# Patient Record
Sex: Male | Born: 1950 | Race: White | Hispanic: No | Marital: Married | Smoking: Never smoker
Health system: Southern US, Community
[De-identification: ages and names within clinical notes are randomized; demographics above are authoritative.]

## PROBLEM LIST (undated history)

## (undated) DIAGNOSIS — M1990 Unspecified osteoarthritis, unspecified site: Secondary | ICD-10-CM

## (undated) DIAGNOSIS — F32A Depression, unspecified: Secondary | ICD-10-CM

## (undated) DIAGNOSIS — F319 Bipolar disorder, unspecified: Secondary | ICD-10-CM

## (undated) DIAGNOSIS — G8929 Other chronic pain: Secondary | ICD-10-CM

## (undated) DIAGNOSIS — I10 Essential (primary) hypertension: Secondary | ICD-10-CM

## (undated) DIAGNOSIS — F329 Major depressive disorder, single episode, unspecified: Secondary | ICD-10-CM

## (undated) DIAGNOSIS — K219 Gastro-esophageal reflux disease without esophagitis: Secondary | ICD-10-CM

## (undated) HISTORY — DX: Major depressive disorder, single episode, unspecified: F32.9

## (undated) HISTORY — PX: KENALOG INJECTION: SHX5298

## (undated) HISTORY — PX: TONSILLECTOMY: SUR1361

## (undated) HISTORY — DX: Bipolar disorder, unspecified: F31.9

## (undated) HISTORY — PX: COLONOSCOPY: SHX174

## (undated) HISTORY — DX: Depression, unspecified: F32.A

## (undated) HISTORY — DX: Unspecified osteoarthritis, unspecified site: M19.90

## (undated) HISTORY — DX: Essential (primary) hypertension: I10

## (undated) HISTORY — DX: Gastro-esophageal reflux disease without esophagitis: K21.9

## (undated) HISTORY — DX: Other chronic pain: G89.29

---

## 2005-01-28 ENCOUNTER — Encounter: Admission: RE | Admit: 2005-01-28 | Discharge: 2005-01-28 | Payer: Self-pay | Admitting: Internal Medicine

## 2007-08-14 ENCOUNTER — Encounter: Admission: RE | Admit: 2007-08-14 | Discharge: 2007-08-14 | Payer: Self-pay | Admitting: Gastroenterology

## 2009-08-09 ENCOUNTER — Emergency Department (HOSPITAL_COMMUNITY): Admission: EM | Admit: 2009-08-09 | Discharge: 2009-08-09 | Payer: Self-pay | Admitting: Emergency Medicine

## 2009-10-23 ENCOUNTER — Ambulatory Visit (HOSPITAL_COMMUNITY): Admission: RE | Admit: 2009-10-23 | Discharge: 2009-10-23 | Payer: Self-pay | Admitting: Family Medicine

## 2009-12-20 ENCOUNTER — Inpatient Hospital Stay (HOSPITAL_COMMUNITY): Admission: EM | Admit: 2009-12-20 | Discharge: 2009-12-21 | Payer: Self-pay | Admitting: Emergency Medicine

## 2010-01-14 ENCOUNTER — Ambulatory Visit (HOSPITAL_COMMUNITY): Admission: RE | Admit: 2010-01-14 | Discharge: 2010-01-14 | Payer: Self-pay | Admitting: Family Medicine

## 2010-02-25 ENCOUNTER — Emergency Department (HOSPITAL_COMMUNITY): Admission: EM | Admit: 2010-02-25 | Discharge: 2009-04-28 | Payer: Self-pay | Admitting: Emergency Medicine

## 2010-06-03 LAB — COMPREHENSIVE METABOLIC PANEL
ALT: 8 U/L (ref 0–53)
AST: 12 U/L (ref 0–37)
Albumin: 4.1 g/dL (ref 3.5–5.2)
Alkaline Phosphatase: 43 U/L (ref 39–117)
BUN: 7 mg/dL (ref 6–23)
CO2: 29 mEq/L (ref 19–32)
Calcium: 8.9 mg/dL (ref 8.4–10.5)
Chloride: 98 mEq/L (ref 96–112)
Creatinine, Ser: 1.11 mg/dL (ref 0.4–1.5)
GFR calc Af Amer: >60 mL/min (ref >60)
GFR calc non Af Amer: >60 mL/min (ref >60)
Glucose, Bld: 91 mg/dL (ref 70–99)
Potassium: 3.5 mEq/L (ref 3.5–5.1)
Sodium: 138 mEq/L (ref 135–145)
Total Bilirubin: 0.4 mg/dL (ref 0.3–1.2)
Total Protein: 7.1 g/dL (ref 6.0–8.3)

## 2010-06-03 LAB — CBC
HCT: 31.5 % — ABNORMAL LOW (ref 39.0–52.0)
HCT: 31.8 % — ABNORMAL LOW (ref 39.0–52.0)
Hemoglobin: 11 g/dL — ABNORMAL LOW (ref 13.0–17.0)
MCH: 31.5 pg (ref 26.0–34.0)
MCHC: 34.8 g/dL (ref 30.0–36.0)
MCV: 90.6 fL (ref 78.0–100.0)
MCV: 91.6 fL (ref 78.0–100.0)
Platelets: 226 10*3/uL (ref 150–400)
RBC: 3.47 MIL/uL — ABNORMAL LOW (ref 4.22–5.81)
RBC: 3.48 MIL/uL — ABNORMAL LOW (ref 4.22–5.81)
RDW: 13.1 % (ref 11.5–15.5)
WBC: 8.7 10*3/uL (ref 4.0–10.5)
WBC: 9.5 10*3/uL (ref 4.0–10.5)

## 2010-06-03 LAB — RAPID URINE DRUG SCREEN, HOSP PERFORMED
Amphetamines: POSITIVE — AB
Barbiturates: NOT DETECTED
Benzodiazepines: NOT DETECTED
Cocaine: NOT DETECTED
Opiates: POSITIVE — AB
Tetrahydrocannabinol: NOT DETECTED

## 2010-06-03 LAB — DIFFERENTIAL
Basophils Absolute: 0 10*3/uL (ref 0.0–0.1)
Basophils Relative: 0 % (ref 0–1)
Eosinophils Absolute: 0.5 10*3/uL (ref 0.0–0.7)
Eosinophils Relative: 2 % (ref 0–5)
Eosinophils Relative: 6 % — ABNORMAL HIGH (ref 0–5)
Lymphocytes Relative: 20 % (ref 12–46)
Lymphocytes Relative: 29 % (ref 12–46)
Lymphs Abs: 1.9 10*3/uL (ref 0.7–4.0)
Lymphs Abs: 2.5 10*3/uL (ref 0.7–4.0)
Monocytes Absolute: 0.9 10*3/uL (ref 0.1–1.0)
Monocytes Absolute: 1.2 10*3/uL — ABNORMAL HIGH (ref 0.1–1.0)
Monocytes Relative: 13 % — ABNORMAL HIGH (ref 3–12)
Neutro Abs: 5.8 10*3/uL (ref 1.7–7.7)
Neutrophils Relative %: 61 % (ref 43–77)

## 2010-06-03 LAB — BASIC METABOLIC PANEL
BUN: 5 mg/dL — ABNORMAL LOW (ref 6–23)
CO2: 29 mEq/L (ref 19–32)
Calcium: 8.9 mg/dL (ref 8.4–10.5)
Chloride: 105 mEq/L (ref 96–112)
Creatinine, Ser: 0.85 mg/dL (ref 0.4–1.5)
GFR calc Af Amer: >60 mL/min (ref >60)
GFR calc non Af Amer: >60 mL/min (ref >60)
Glucose, Bld: 96 mg/dL (ref 70–99)
Potassium: 3.4 mEq/L — ABNORMAL LOW (ref 3.5–5.1)
Sodium: 144 mEq/L (ref 135–145)

## 2010-06-03 LAB — TSH: TSH: 2.289 u[IU]/mL (ref 0.350–4.500)

## 2010-06-03 LAB — CARDIAC PANEL(CRET KIN+CKTOT+MB+TROPI)
CK, MB: 0.7 ng/mL (ref 0.3–4.0)
CK, MB: 0.7 ng/mL (ref 0.3–4.0)
Relative Index: INVALID (ref 0.0–2.5)
Relative Index: INVALID (ref 0.0–2.5)
Total CK: 41 U/L (ref 7–232)
Total CK: 42 U/L (ref 7–232)
Troponin I: 0.01 ng/mL (ref 0.00–0.06)
Troponin I: 0.01 ng/mL (ref 0.00–0.06)

## 2010-06-03 LAB — URINALYSIS, ROUTINE W REFLEX MICROSCOPIC
Bilirubin Urine: NEGATIVE
Glucose, UA: NEGATIVE mg/dL
Ketones, ur: NEGATIVE mg/dL
Leukocytes, UA: NEGATIVE
Nitrite: NEGATIVE
Protein, ur: NEGATIVE mg/dL
Specific Gravity, Urine: 1.01 (ref 1.005–1.030)
Urobilinogen, UA: 0.2 mg/dL (ref 0.0–1.0)
pH: 6 (ref 5.0–8.0)

## 2010-06-03 LAB — POCT CARDIAC MARKERS
CKMB, poc: <1 ng/mL — ABNORMAL LOW (ref 1.0–8.0)
Myoglobin, poc: 72.5 ng/mL (ref 12–200)
Troponin i, poc: <0.05 ng/mL (ref 0.00–0.09)

## 2010-06-03 LAB — IRON AND TIBC: UIBC: 248 ug/dL

## 2010-06-03 LAB — URINE MICROSCOPIC-ADD ON

## 2010-06-03 LAB — CK TOTAL AND CKMB (NOT AT ARMC)
CK, MB: 0.8 ng/mL (ref 0.3–4.0)
Relative Index: INVALID (ref 0.0–2.5)
Total CK: 51 U/L (ref 7–232)

## 2010-06-03 LAB — CULTURE, BLOOD (ROUTINE X 2)
Culture: NO GROWTH
Culture: NO GROWTH
Report Status: 10072011
Report Status: 10072011

## 2010-06-03 LAB — MRSA PCR SCREENING: MRSA by PCR: NEGATIVE

## 2010-06-03 LAB — TROPONIN I: Troponin I: 0.02 ng/mL (ref 0.00–0.06)

## 2010-06-03 LAB — RETICULOCYTES
RBC.: 3.47 MIL/uL — ABNORMAL LOW (ref 4.22–5.81)
Retic Ct Pct: 1.1 % (ref 0.4–3.1)

## 2010-06-03 LAB — VALPROIC ACID LEVEL: Valproic Acid Lvl: 67 ug/mL (ref 50.0–100.0)

## 2010-06-03 LAB — LACTIC ACID, PLASMA: Lactic Acid, Venous: 1 mmol/L (ref 0.5–2.2)

## 2010-06-03 LAB — FERRITIN: Ferritin: 90 ng/mL (ref 22–322)

## 2010-06-03 LAB — VITAMIN B12: Vitamin B-12: 577 pg/mL (ref 211–911)

## 2010-10-01 DIAGNOSIS — M545 Low back pain, unspecified: Secondary | ICD-10-CM | POA: Insufficient documentation

## 2010-10-01 DIAGNOSIS — Z79899 Other long term (current) drug therapy: Secondary | ICD-10-CM | POA: Insufficient documentation

## 2010-10-01 DIAGNOSIS — M542 Cervicalgia: Secondary | ICD-10-CM | POA: Insufficient documentation

## 2010-10-01 DIAGNOSIS — M48061 Spinal stenosis, lumbar region without neurogenic claudication: Secondary | ICD-10-CM | POA: Insufficient documentation

## 2011-06-30 IMAGING — CT CT HEAD W/O CM
1 series · 16 of 30 positions shown, 20 images · non-contrast
Comparison: None

CLINICAL DATA: Altered level of consciousness, confusion,
hypertension

CT HEAD WITHOUT CONTRAST
TECHNIQUE: Contiguous axial images were obtained from the base of
the skull through the vertex without contrast.

[Series 2: headseq 4.8 h37s · axial · 0.44mm/px · z∈[+941,+1101]mm · 16 of 36 slices shown, 20 images]
[im 2/36  brain]
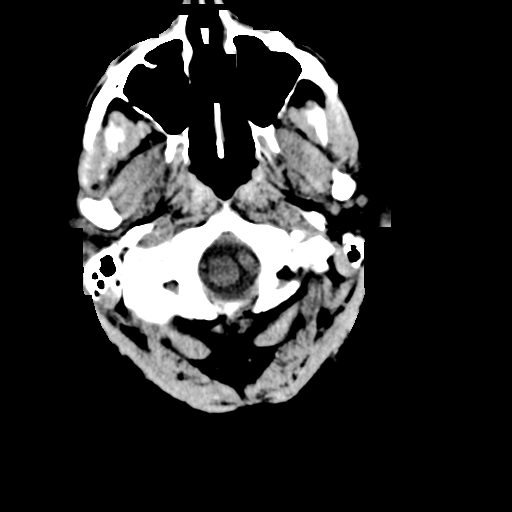
[im 2/36  bone]
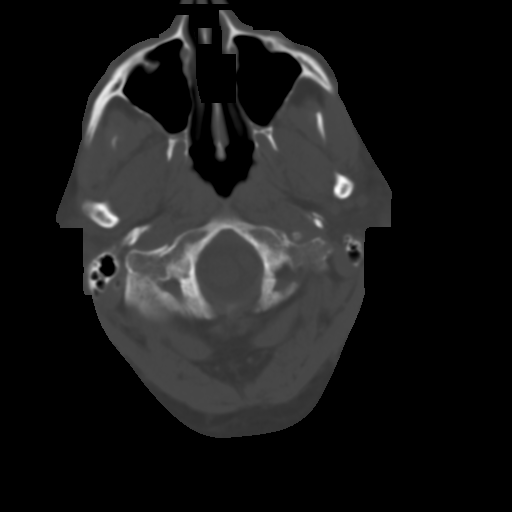
[im 4/36  brain]
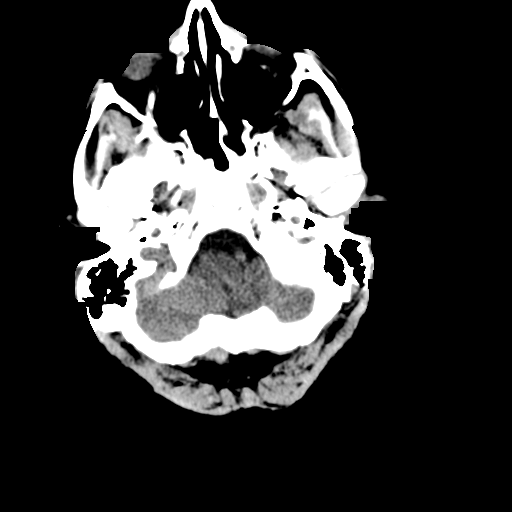
[im 7/36  brain]
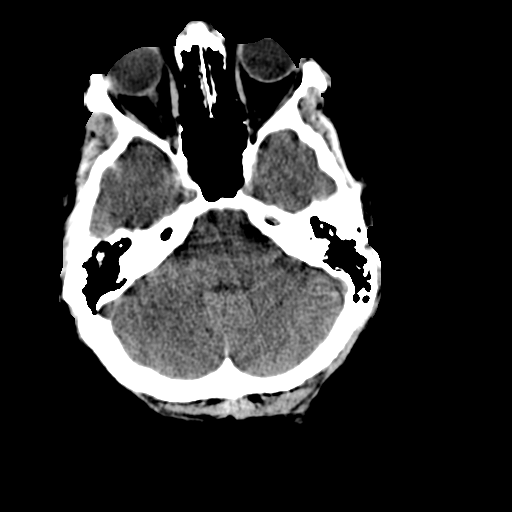
[im 9/36  brain]
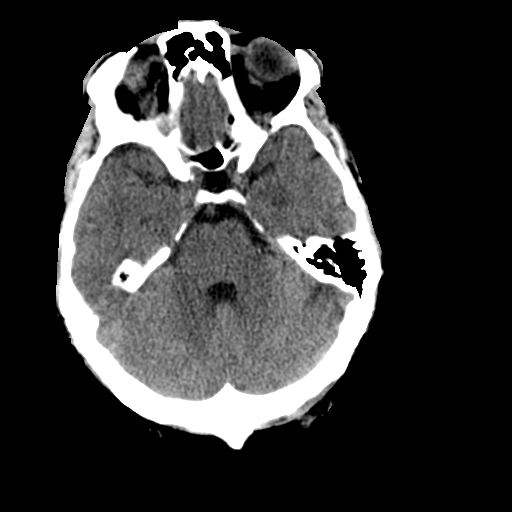
[im 10/36  brain]
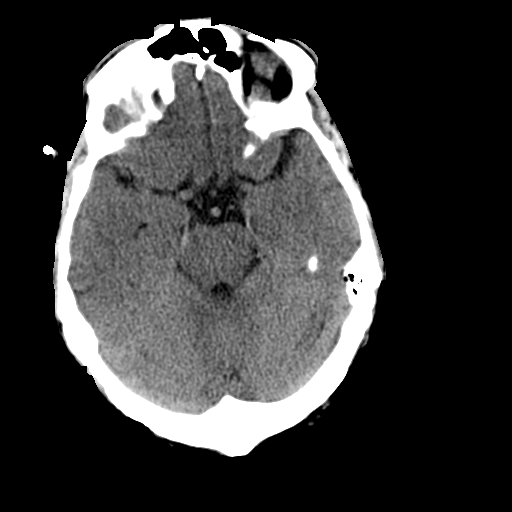
[im 10/36  bone]
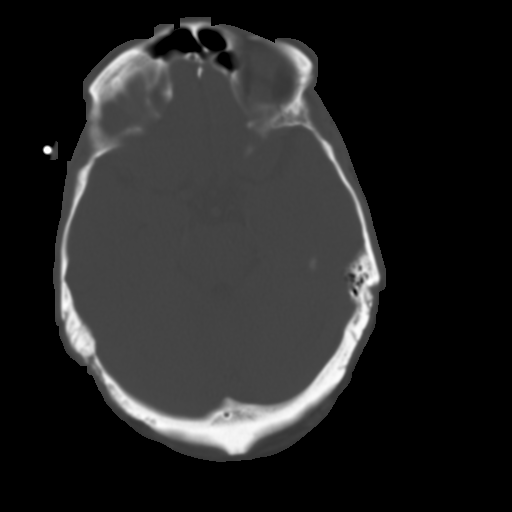
[im 13/36  brain]
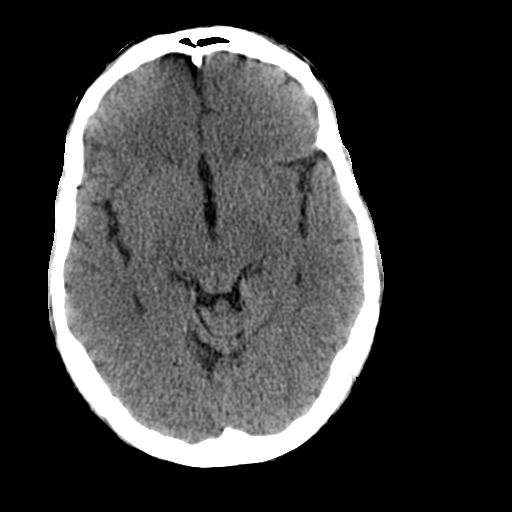
[im 15/36  brain]
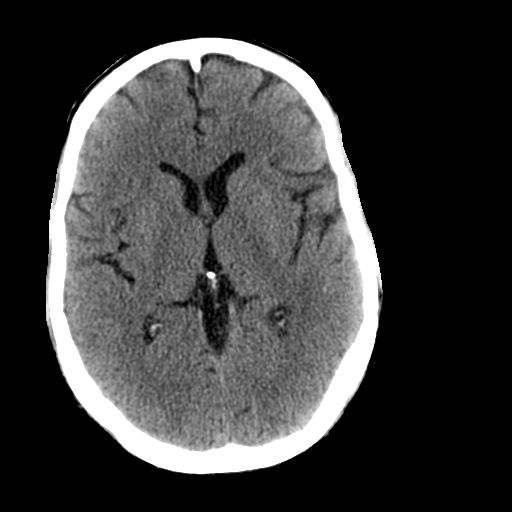
[im 17/36  brain]
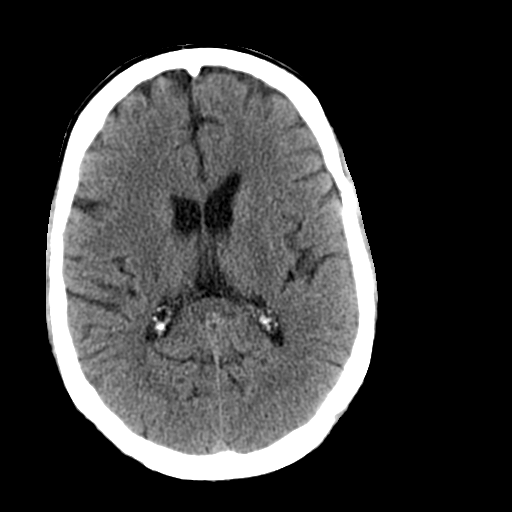
[im 19/36  brain]
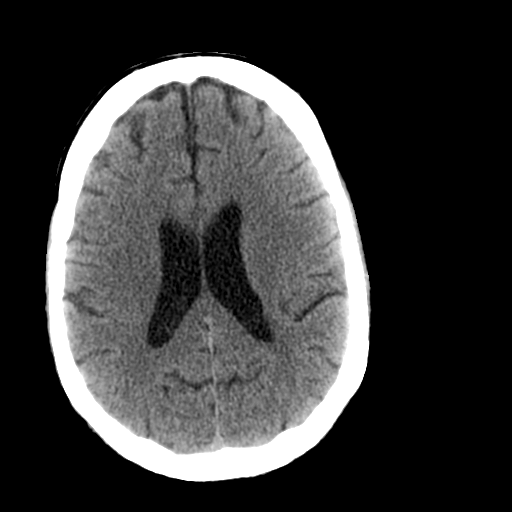
[im 19/36  bone]
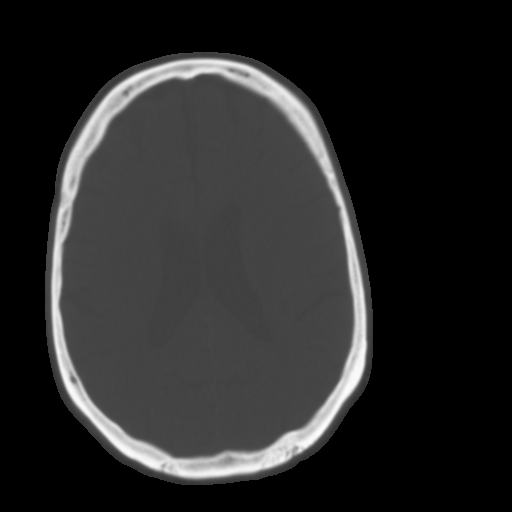
[im 21/36  brain]
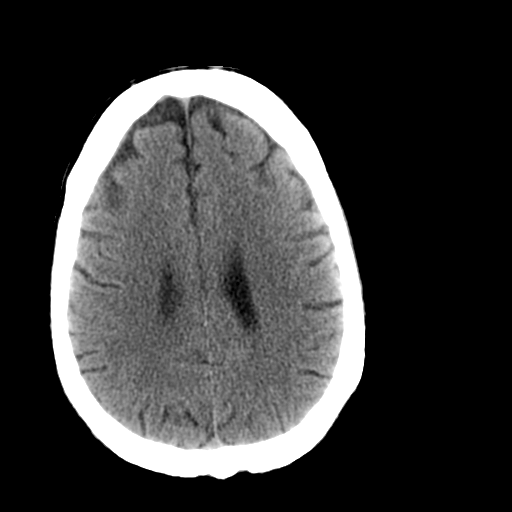
[im 23/36  brain]
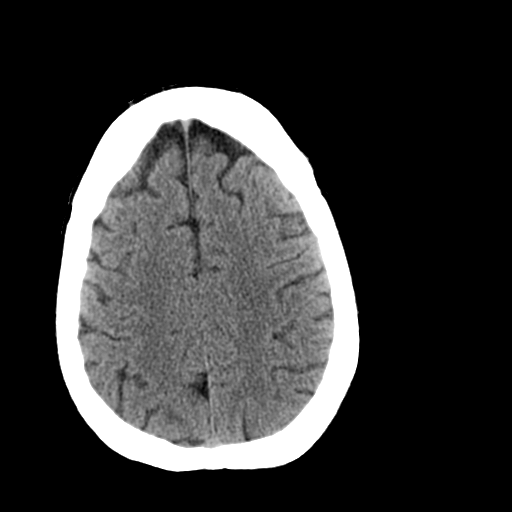
[im 26/36  brain]
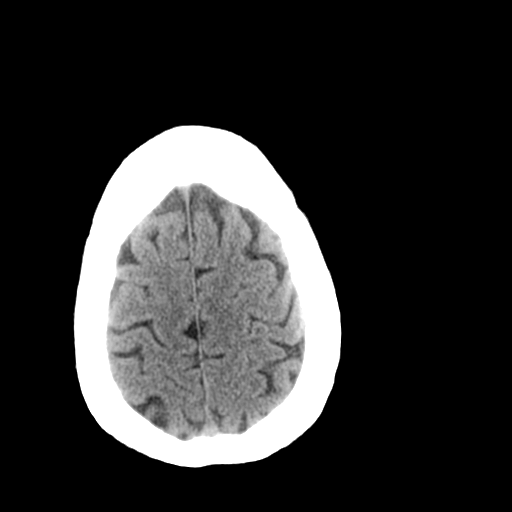
[im 27/36  brain]
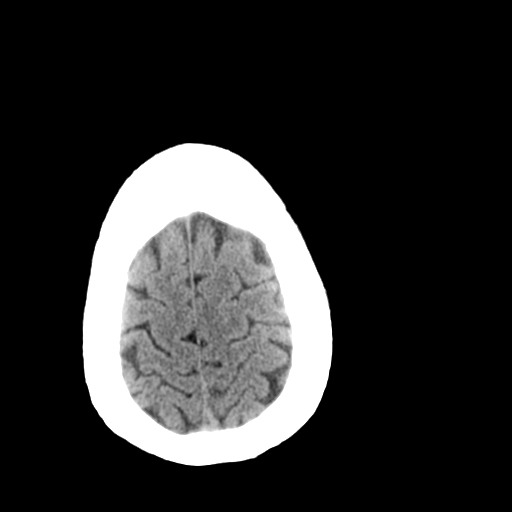
[im 27/36  bone]
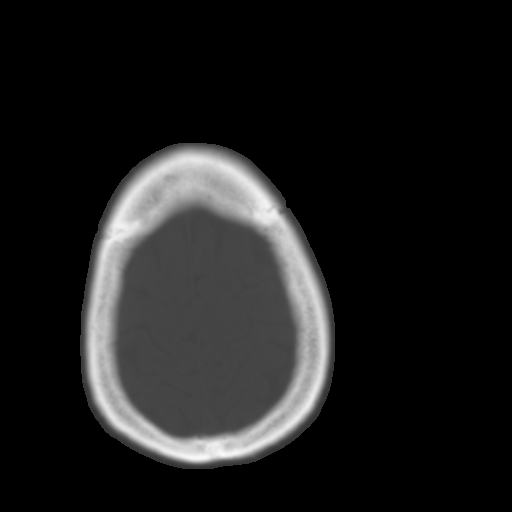
[im 29/36  brain]
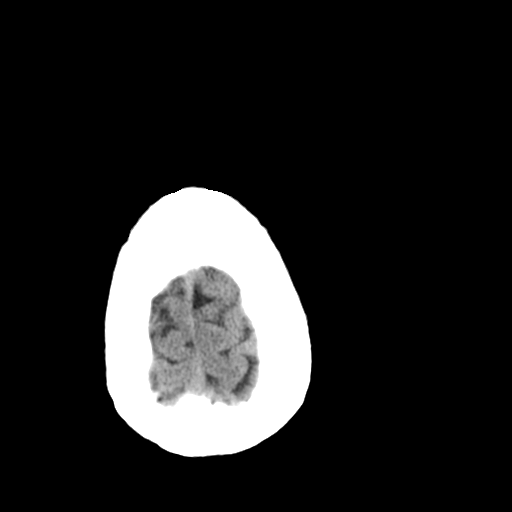
[im 32/36  brain]
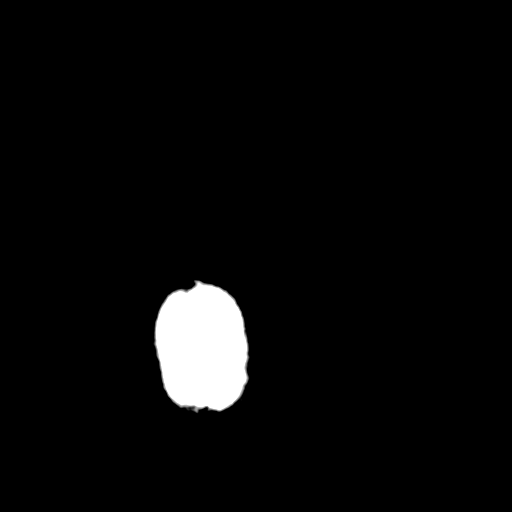
[im 34/36  brain]
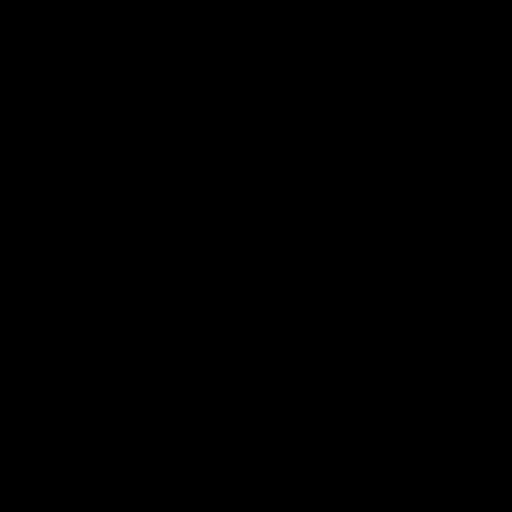

[16 of 30 positions shown; findings below may reference images not displayed]

FINDINGS: Normal ventricular morphology.
No midline shift or mass effect.
Normal appearance of brain parenchyma.
No intracranial hemorrhage, mass lesion, or acute infarction.
Visualized paranasal sinuses and mastoid air cells clear.
Bones unremarkable.
IMPRESSION: No acute intracranial abnormalities.

## 2011-07-25 IMAGING — CR DG CHEST 2V
2 series · 2 of 2 positions shown · non-contrast
Comparison: 12/20/2009

CLINICAL DATA: Pneumonia

CHEST - 2 VIEW

[view not recorded (1 of 2)]
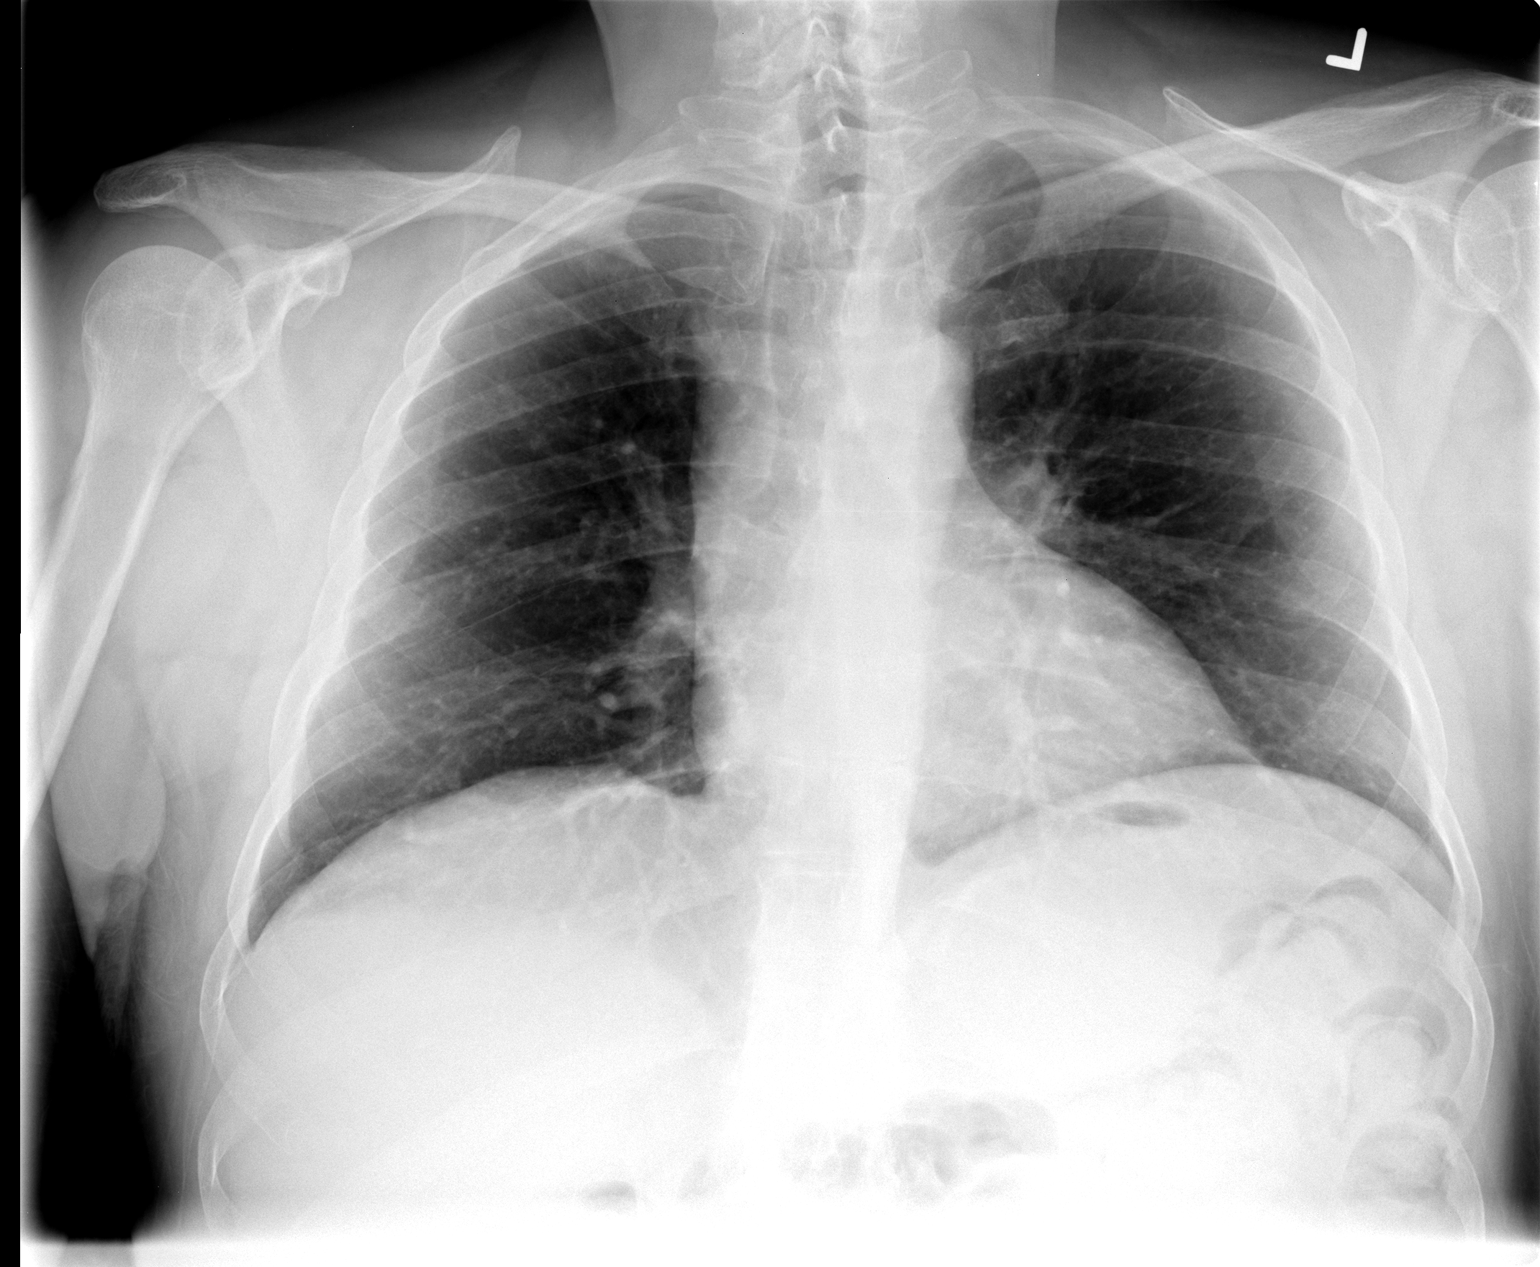

[view not recorded (2 of 2)]
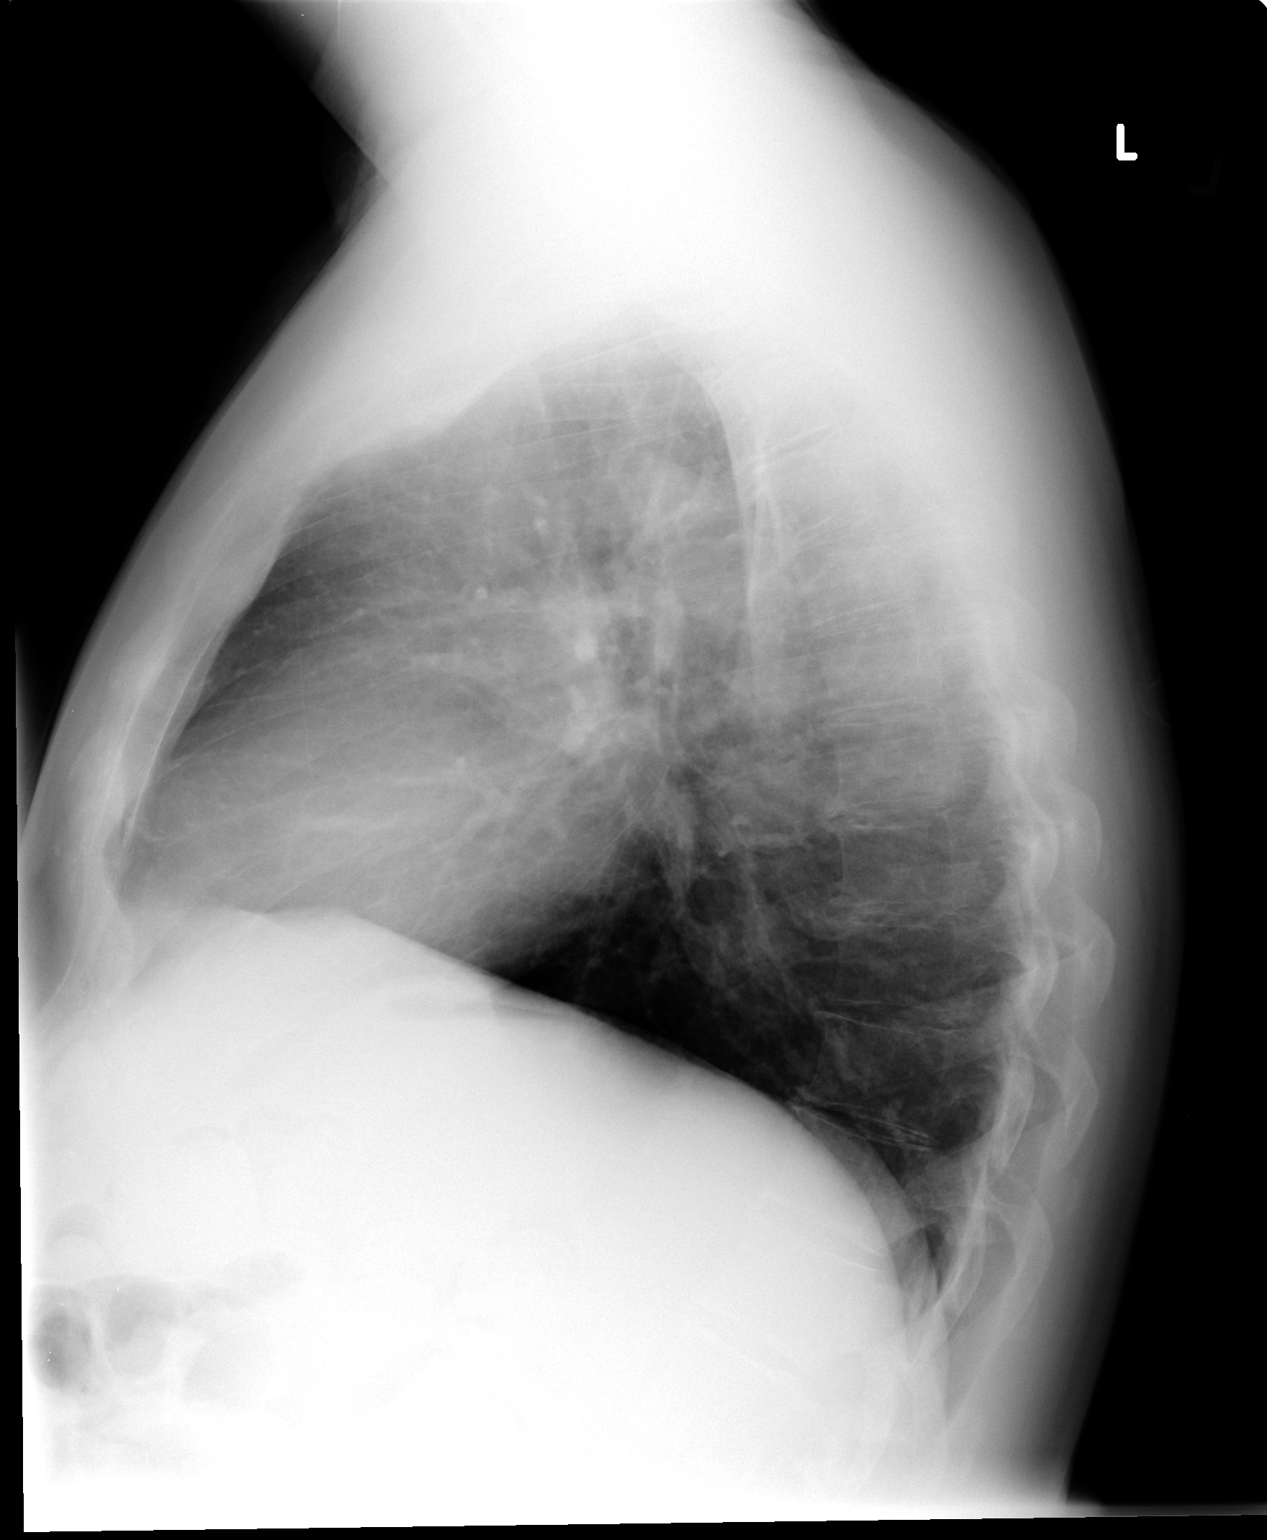

[2 of 2 positions shown; findings below may reference images not displayed]

FINDINGS: There has been interval resolution of the right lung
airspace disease.  Lungs are clear.  Heart size normal.  No
effusion.  Regional bones unremarkable.
IMPRESSION: 1.  Resolution of the right   lung pneumonia.

## 2012-07-13 ENCOUNTER — Other Ambulatory Visit (HOSPITAL_COMMUNITY): Payer: Self-pay | Admitting: Family Medicine

## 2012-07-13 ENCOUNTER — Telehealth: Payer: Self-pay | Admitting: Family Medicine

## 2012-07-13 NOTE — Telephone Encounter (Signed)
Patient needs a refill of his generic lotrel to [facility]. He has an appointment for a PE in the next week.

## 2012-07-13 NOTE — Telephone Encounter (Signed)
Med sent electronically to [facility].

## 2012-07-23 ENCOUNTER — Encounter: Payer: Self-pay | Admitting: Family Medicine

## 2012-07-23 ENCOUNTER — Ambulatory Visit (INDEPENDENT_AMBULATORY_CARE_PROVIDER_SITE_OTHER): Payer: Medicare Other | Admitting: Family Medicine

## 2012-07-23 VITALS — BP 150/78 | HR 80 | Ht 66.5 in | Wt 175.0 lb

## 2012-07-23 DIAGNOSIS — Z0000 Encounter for general adult medical examination without abnormal findings: Secondary | ICD-10-CM

## 2012-07-23 DIAGNOSIS — I10 Essential (primary) hypertension: Secondary | ICD-10-CM

## 2012-07-23 DIAGNOSIS — Z125 Encounter for screening for malignant neoplasm of prostate: Secondary | ICD-10-CM

## 2012-07-23 DIAGNOSIS — Z79899 Other long term (current) drug therapy: Secondary | ICD-10-CM

## 2012-07-23 MED ORDER — BENAZEPRIL HCL 20 MG PO TABS: 20.0000 mg | ORAL_TABLET | Freq: Every day | ORAL | Status: DC

## 2012-07-23 MED ORDER — RANITIDINE HCL 150 MG PO TABS: ORAL_TABLET | ORAL | Status: DC

## 2012-07-23 MED ORDER — ZOLPIDEM TARTRATE 10 MG PO TABS: 10.0000 mg | ORAL_TABLET | Freq: Every evening | ORAL | Status: DC | PRN

## 2012-07-23 MED ORDER — AMLODIPINE BESYLATE 10 MG PO TABS: 10.0000 mg | ORAL_TABLET | Freq: Every day | ORAL | Status: DC

## 2012-07-23 NOTE — Progress Notes (Signed)
  Subjective:    Patient ID: COBI DELPH, male    DOB: 04/23/1950, 62 y.o.   MRN: [id number]  HPI On chronic pain meds and q three monthly injections of steroids and xylocaine.   On bp meds. Very little exercise.  overall watching diet fairly closely.  Added simethicone qhs to help reflux Patient up-to-date on flu vaccine. Recently turned 62. Next colonoscopy due in 2019. Patient notes history of carpal tunnel syndrome. Worse somewhat recently. Wearing his brace during the day.  Patient claims compliance with his blood pressure medicine. Trying to watch his diet.  Patient states his depression is completely stable. He needs his Prozac refilled. No suicidal thoughts. Review of Systems  Constitutional: Negative for fever, activity change and appetite change.  HENT: Negative for congestion, rhinorrhea and neck pain.   Eyes: Negative for discharge.  Respiratory: Negative for cough and wheezing.   Cardiovascular: Negative for chest pain.  Gastrointestinal: Negative for vomiting, abdominal pain and blood in stool.  Genitourinary: Negative for frequency and difficulty urinating.  Skin: Negative for rash.  Allergic/Immunologic: Negative for environmental allergies and food allergies.  Neurological: Negative for weakness and headaches.  Psychiatric/Behavioral: Negative for agitation.       Objective:   Physical Exam  Vitals reviewed. Constitutional: He appears well-developed and well-nourished.  HENT:  Head: Normocephalic and atraumatic.  Right Ear: External ear normal.  Left Ear: External ear normal.  Nose: Nose normal.  Mouth/Throat: Oropharynx is clear and moist.  Eyes: EOM are normal. Pupils are equal, round, and reactive to light.  Neck: Normal range of motion. Neck supple. No thyromegaly present.  Cardiovascular: Normal rate, regular rhythm and normal heart sounds.   No murmur heard. Pulmonary/Chest: Effort normal and breath sounds normal. No respiratory distress. He has  no wheezes.  Abdominal: Soft. Bowel sounds are normal. He exhibits no distension and no mass. There is no tenderness.  Genitourinary: Prostate normal and penis normal.  Musculoskeletal: Normal range of motion. He exhibits no edema.  Lymphadenopathy:    He has no cervical adenopathy.  Neurological: He is alert. He exhibits normal muscle tone.  Skin: Skin is warm and dry. No erythema.  Psychiatric: He has a normal mood and affect. His behavior is normal. Judgment normal.          Assessment & Plan:  Impression yearly wellness exam. #2 hypertension good control. #3 reflux stable. #4 depression stable. #5 carpal tunnel syndrome discussed. Plan medications renewed. Appropriate blood work. Worse plan at night. Shingles vaccine prescription given. WSL and in

## 2012-07-23 NOTE — Progress Notes (Signed)
Hemoccult cards given and rx for  shingles vaccine given

## 2012-07-28 ENCOUNTER — Other Ambulatory Visit: Payer: Self-pay | Admitting: Family Medicine

## 2012-08-06 ENCOUNTER — Telehealth: Payer: Self-pay | Admitting: *Deleted

## 2012-08-06 NOTE — Telephone Encounter (Signed)
Pt's wife is taking a prednisone taper for 3 weeks. He wants to know when will it be safe to get his zostavax vaccine.

## 2012-08-06 NOTE — Telephone Encounter (Signed)
Notified patient that doctor recommends waiting for 3-4 weeks after she finishes the prednisone. He verbalized understanding.

## 2012-08-06 NOTE — Telephone Encounter (Signed)
I would recommend waiting for 3-4 weeks after she finishes the prednisone.

## 2012-08-23 LAB — HEPATIC FUNCTION PANEL
Albumin: 3.7 g/dL (ref 3.5–5.2)
Alkaline Phosphatase: 50 U/L (ref 39–117)
Indirect Bilirubin: 0.3 mg/dL (ref 0.0–0.9)
Total Bilirubin: 0.4 mg/dL (ref 0.3–1.2)

## 2012-08-23 LAB — BASIC METABOLIC PANEL
CO2: 26 mEq/L (ref 19–32)
Chloride: 105 mEq/L (ref 96–112)
Creat: 1.03 mg/dL (ref 0.50–1.35)

## 2012-08-23 LAB — LIPID PANEL: LDL Cholesterol: 109 mg/dL — ABNORMAL HIGH (ref 0–99)

## 2012-08-26 ENCOUNTER — Encounter: Payer: Self-pay | Admitting: Family Medicine

## 2012-08-31 ENCOUNTER — Other Ambulatory Visit (INDEPENDENT_AMBULATORY_CARE_PROVIDER_SITE_OTHER): Payer: Medicare Other | Admitting: *Deleted

## 2012-08-31 DIAGNOSIS — Z0000 Encounter for general adult medical examination without abnormal findings: Secondary | ICD-10-CM

## 2012-08-31 LAB — POC HEMOCCULT BLD/STL (HOME/3-CARD/SCREEN)
Card #2 Fecal Occult Blod, POC: NEGATIVE
Card #3 Fecal Occult Blood, POC: NEGATIVE
Fecal Occult Blood, POC: NEGATIVE

## 2012-10-08 DIAGNOSIS — Z0289 Encounter for other administrative examinations: Secondary | ICD-10-CM

## 2013-01-11 ENCOUNTER — Other Ambulatory Visit: Payer: Self-pay

## 2013-01-11 MED ORDER — OMEPRAZOLE 20 MG PO CPDR: DELAYED_RELEASE_CAPSULE | ORAL | Status: DC

## 2013-02-15 ENCOUNTER — Other Ambulatory Visit: Payer: Self-pay | Admitting: Family Medicine

## 2013-02-15 NOTE — Telephone Encounter (Signed)
Ok for both times one, needs 6 mo f u

## 2013-02-15 NOTE — Telephone Encounter (Signed)
Last seen 5/14

## 2013-02-20 ENCOUNTER — Encounter: Payer: Self-pay | Admitting: Family Medicine

## 2013-02-20 ENCOUNTER — Ambulatory Visit (INDEPENDENT_AMBULATORY_CARE_PROVIDER_SITE_OTHER): Payer: Medicare Other | Admitting: Family Medicine

## 2013-02-20 VITALS — BP 140/80 | Ht 69.0 in | Wt 172.0 lb

## 2013-02-20 DIAGNOSIS — I872 Venous insufficiency (chronic) (peripheral): Secondary | ICD-10-CM

## 2013-02-20 DIAGNOSIS — I878 Other specified disorders of veins: Secondary | ICD-10-CM

## 2013-02-20 DIAGNOSIS — G894 Chronic pain syndrome: Secondary | ICD-10-CM

## 2013-02-20 DIAGNOSIS — I10 Essential (primary) hypertension: Secondary | ICD-10-CM

## 2013-02-20 DIAGNOSIS — D649 Anemia, unspecified: Secondary | ICD-10-CM

## 2013-02-20 DIAGNOSIS — K219 Gastro-esophageal reflux disease without esophagitis: Secondary | ICD-10-CM

## 2013-02-20 MED ORDER — BENAZEPRIL HCL 20 MG PO TABS: ORAL_TABLET | ORAL | Status: DC

## 2013-02-20 MED ORDER — AMLODIPINE BESYLATE 10 MG PO TABS: ORAL_TABLET | ORAL | Status: DC

## 2013-02-20 NOTE — Progress Notes (Signed)
   Subjective:    Patient ID: Gary Harris, male    DOB: [birth year], 62 y.o.   MRN: [id number]  HPI Patient is here today for a 6 month check up.  Would like to discuss getting a pneumococcal vaccine.  Would like blood work done. Not sure when he had his last screening blood work which was actually just recently.  Wants you to check his legs out. Concerned about blood flow to his feet. Occasional slight swollen ankles.  Was anemic in the past (due to a medication) did not know if he needs his HgB checked again? History of anemia in the past.  Notes reflux aggravating at times. Medication does seem to help.  Claims compliance with blood pressure medicine. Trying to watch salt intake. Not exercising much no headache   Review of Systems Alert no chest pain chronic back pain noted. Chronic leg pain noted. No change in bowel habits no abdominal pain no blood in stool ROS otherwise negative    Objective:   Physical Exam Alert hydration good. H&T normal. Lungs clear. Heart rare rhythm. Ankles trace edema at most. Heart show pulses strong. Significant venous varices and stasis changes right leg greater than left.       Assessment & Plan:  Impression 1 anemia stable discussed #2 hypertension good control. #3 reflux clinically stable. #4 venous stasis discussed. Plan diet exercise discussed. Maintain same medications. Hold off further blood work for now. Followup with specialist for all many concerns. WSL

## 2013-02-22 DIAGNOSIS — G894 Chronic pain syndrome: Secondary | ICD-10-CM | POA: Insufficient documentation

## 2013-02-22 DIAGNOSIS — K219 Gastro-esophageal reflux disease without esophagitis: Secondary | ICD-10-CM | POA: Insufficient documentation

## 2013-02-22 DIAGNOSIS — I878 Other specified disorders of veins: Secondary | ICD-10-CM | POA: Insufficient documentation

## 2013-02-22 DIAGNOSIS — I10 Essential (primary) hypertension: Secondary | ICD-10-CM | POA: Insufficient documentation

## 2013-05-15 ENCOUNTER — Other Ambulatory Visit: Payer: Self-pay | Admitting: Family Medicine

## 2013-07-27 ENCOUNTER — Emergency Department (HOSPITAL_COMMUNITY)
Admission: EM | Admit: 2013-07-27 | Discharge: 2013-07-27 | Disposition: A | Discharge status: Home / Self Care | Payer: Medicare Other | Attending: Emergency Medicine | Admitting: Emergency Medicine

## 2013-07-27 ENCOUNTER — Encounter (HOSPITAL_COMMUNITY): Payer: Self-pay | Admitting: Emergency Medicine

## 2013-07-27 DIAGNOSIS — F3289 Other specified depressive episodes: Secondary | ICD-10-CM | POA: Insufficient documentation

## 2013-07-27 DIAGNOSIS — G8929 Other chronic pain: Secondary | ICD-10-CM | POA: Insufficient documentation

## 2013-07-27 DIAGNOSIS — Z79899 Other long term (current) drug therapy: Secondary | ICD-10-CM | POA: Insufficient documentation

## 2013-07-27 DIAGNOSIS — K219 Gastro-esophageal reflux disease without esophagitis: Secondary | ICD-10-CM | POA: Insufficient documentation

## 2013-07-27 DIAGNOSIS — F329 Major depressive disorder, single episode, unspecified: Secondary | ICD-10-CM | POA: Insufficient documentation

## 2013-07-27 DIAGNOSIS — Z760 Encounter for issue of repeat prescription: Secondary | ICD-10-CM | POA: Insufficient documentation

## 2013-07-27 DIAGNOSIS — I10 Essential (primary) hypertension: Secondary | ICD-10-CM | POA: Insufficient documentation

## 2013-07-27 DIAGNOSIS — M545 Low back pain, unspecified: Secondary | ICD-10-CM | POA: Insufficient documentation

## 2013-07-27 DIAGNOSIS — M129 Arthropathy, unspecified: Secondary | ICD-10-CM | POA: Insufficient documentation

## 2013-07-27 MED ORDER — OXYCODONE-ACETAMINOPHEN 10-325 MG PO TABS: 1.0000 | ORAL_TABLET | ORAL | Status: AC | PRN

## 2013-07-27 MED ORDER — MORPHINE SULFATE ER 60 MG PO CP24: 60.0000 mg | ORAL_CAPSULE | Freq: Two times a day (BID) | ORAL | Status: DC

## 2013-07-27 MED ORDER — MORPHINE SULFATE 30 MG PO TABS: 60.0000 mg | ORAL_TABLET | Freq: Two times a day (BID) | ORAL | Status: DC

## 2013-07-27 MED ORDER — MORPHINE SULFATE ER 60 MG PO TBCR: 60.0000 mg | EXTENDED_RELEASE_TABLET | Freq: Two times a day (BID) | ORAL | Status: DC

## 2013-07-27 NOTE — ED Notes (Signed)
Pt missed appt with pain clinic MD due to mother recently dx with cancer, pt needs refill on pain meds for chronic back pain til he sees MD again, pt has rescheduled appt already

## 2013-07-27 NOTE — ED Notes (Signed)
Pt missed his visit last week to [facility] due to his mother being sick. Doctor on call told pt to come to ED and have refill for 3 days until he can get back in to see them. Rx for oxycodone 10-325 and Morphine 60mg  tab.

## 2013-07-27 NOTE — ED Provider Notes (Signed)
Medical screening examination/treatment/procedure(s) were performed by non-physician practitioner and as supervising physician I was immediately available for consultation/collaboration.  Flint MelterElliott L Vayda Dungee, MD 07/27/13 [phone]

## 2013-07-27 NOTE — Discharge Instructions (Signed)
Medication Refill, Emergency Department  We have refilled your medication today as a courtesy to you. It is best for your medical care, however, to take care of getting refills done through your primary caregiver's office. They have your records and can do a better job of follow-up than we can in the emergency department.  On maintenance medications, we often only prescribe enough medications to get you by until you are able to see your regular caregiver. This is a more expensive way to refill medications.  In the future, please plan for refills so that you will not have to use the emergency department for this.  Thank you for your help. Your help allows us to better take care of the daily emergencies that enter our department.  Document Released: 06/24/2003 Document Revised: 05/30/2011 Document Reviewed: 03/07/2005  [employer]® Patient Information ©2014 [employer].

## 2013-07-27 NOTE — ED Provider Notes (Signed)
CSN: [id number]     Arrival date & time 07/27/13  1223 History   First MD Initiated Contact with Patient 07/27/13 1248     Chief Complaint  Patient presents with  . Medication Refill     (Consider location/radiation/quality/duration/timing/severity/associated sxs/prior Treatment) The history is provided by the patient and the spouse.   Gary Harris is a 63 y.o. male who presents to the ED for medication refill. He is [facility] and chronic pain. He missed his appointment for follow up and is out of his pain medication. He called his doctor today and was told to come to the ED for 3 days of medication until he could be seen there for follow up. The pain has back pain that causes his feet to hurt as well. He is taking Oxycodone 10/325 every 4 to 6 hours and Morphine 60 mg. Bid. He has his bottles with him and the information [facility].   Past Medical History  Diagnosis Date  . Hypertension   . Arthritis   . Depression   . GERD (gastroesophageal reflux disease)   . Chronic pain   . Bipolar disorder    Past Surgical History  Procedure Laterality Date  . Tonsillectomy    . Colonoscopy     Family History  Problem Relation Age of Onset  . Hypertension Mother   . Heart attack Father   . Hypertension Sister   . Cancer Other     breast   History  Substance Use Topics  . Smoking status: Never Smoker   . Smokeless tobacco: Not on file  . Alcohol Use: No    Review of Systems Negative except as stated in HPI   Allergies  Effexor xr; Paxil; and Clindamycin/lincomycin  Home Medications   Prior to Admission medications   Medication Sig Start Date End Date Taking? Authorizing Provider  amLODipine (NORVASC) 10 MG tablet take 1 tablet by mouth once daily 02/20/13   Merlyn AlbertWilliam S Luking, MD  benazepril (LOTENSIN) 20 MG tablet take 1 tablet by mouth once daily 02/20/13   Merlyn AlbertWilliam S Luking, MD  fexofenadine (ALLEGRA) 60 MG tablet Take 60 mg by  mouth daily. PRN    Historical Provider, MD  FLUoxetine (PROZAC) 20 MG capsule take 1 capsule by mouth once daily 07/28/12   Merlyn AlbertWilliam S Luking, MD  ibuprofen (ADVIL,MOTRIN) 200 MG tablet Take 200 mg by mouth every 6 (six) hours as needed for pain.    Historical Provider, MD  KRILL OIL OMEGA-3 PO Take by mouth daily.    Historical Provider, MD  morphine (MS CONTIN) 60 MG 12 hr tablet Take 60 mg by mouth 2 (two) times daily.    Historical Provider, MD  Multiple Vitamin (MULTIVITAMIN) tablet Take 1 tablet by mouth daily.    Historical Provider, MD  Nasal Dilators (BREATHE RIGHT EXTRA) STRP by Does not apply route.    Historical Provider, MD  omeprazole (PRILOSEC) 20 MG capsule 2 tablets BID PRN 01/11/13   Merlyn AlbertWilliam S Luking, MD  oxyCODONE-acetaminophen (PERCOCET) 10-325 MG per tablet Take by mouth. Take 1 tablet up to 4-5 times daily    Historical Provider, MD  ranitidine (ZANTAC) 150 MG tablet take 2 tablets by mouth twice a day if needed 05/15/13   Merlyn AlbertWilliam S Luking, MD  SALINE NASAL SPRAY NA Place into the nose.    Historical Provider, MD  Simethicone (GAS RELIEF PO) Take by mouth.    Historical Provider, MD  zolpidem (  AMBIEN) 10 MG tablet Take 1 tablet (10 mg total) by mouth at bedtime as needed for sleep. 07/23/12   Merlyn AlbertWilliam S Luking, MD   BP 126/73  Pulse 84  Temp(Src) 98.4 F (36.9 C) (Oral)  Resp 16  SpO2 100% Physical Exam  Nursing note and vitals reviewed. Constitutional: He is oriented to person, place, and time. He appears well-developed and well-nourished.  HENT:  Head: Normocephalic.  Eyes: Conjunctivae and EOM are normal.  Neck: Neck supple.  Cardiovascular: Normal rate and regular rhythm.   Pulmonary/Chest: Effort normal. No respiratory distress.  Musculoskeletal:       Lumbar back: He exhibits decreased range of motion and pain.  Pain in lower back with palpation. Pain in back causes pain in bilateral feet. Pedal pulses present, good touch sensation.  Neurological: He is alert  and oriented to person, place, and time. No cranial nerve deficit.  Skin: Skin is warm and dry.  Psychiatric: He has a normal mood and affect. His behavior is normal.    ED Course: discussed with Dr. Effie ShyWentz  Procedures (including critical care time) Labs Review  MDM  63 y.o. male with chronic pain here for medication until he can see his doctor in 3 days. T[facility] to come [facility]. Patient missed his appointment for follow up and has it rescheduled in 3 days. Discussed with the patient medication refills in the ED. Discussed with the patient and all questioned fully answered.   Medication List    ASK your doctor about these medications       amLODipine 10 MG tablet  Commonly known as:  NORVASC  take 1 tablet by mouth once daily     benazepril 20 MG tablet  Commonly known as:  LOTENSIN  take 1 tablet by mouth once daily     BREATHE RIGHT EXTRA Strp  by Does not apply route.     fexofenadine 60 MG tablet  Commonly known as:  ALLEGRA  Take 60 mg by mouth daily. PRN     FLUoxetine 20 MG capsule  Commonly known as:  PROZAC  take 1 capsule by mouth once daily     GAS RELIEF PO  Take by mouth.     ibuprofen 200 MG tablet  Commonly known as:  ADVIL,MOTRIN  Take 200 mg by mouth every 6 (six) hours as needed for pain.     KRILL OIL OMEGA-3 PO  Take by mouth daily.     morphine 60 MG 12 hr tablet  Commonly known as:  MS CONTIN  Take 60 mg by mouth 2 (two) times daily.  Ask about: Which instructions should I use?     morphine 30 MG tablet  Commonly known as:  MSIR  Take 2 tablets (60 mg total) by mouth 2 (two) times daily.  Ask about: Which instructions should I use?     multivitamin tablet  Take 1 tablet by mouth daily.     omeprazole 20 MG capsule  Commonly known as:  PRILOSEC  2 tablets BID PRN     oxyCODONE-acetaminophen 10-325 MG per tablet  Commonly known as:  PERCOCET  Take by mouth. Take 1 tablet up to 4-5 times daily  Ask about: Which  instructions should I use?     oxyCODONE-acetaminophen 10-325 MG per tablet  Commonly known as:  PERCOCET  Take 1 tablet by mouth every 4 (four) hours as needed for pain.  Ask about: Which instructions should I use?  ranitidine 150 MG tablet  Commonly known as:  ZANTAC  take 2 tablets by mouth twice a day if needed     SALINE NASAL SPRAY NA  Place into the nose.     zolpidem 10 MG tablet  Commonly known as:  AMBIEN  Take 1 tablet (10 mg total) by mouth at bedtime as needed for sleep.          [place], NP 07/27/13 [address] [place], [place] 07/27/13 [phone]

## 2013-08-08 ENCOUNTER — Other Ambulatory Visit: Payer: Self-pay | Admitting: Family Medicine

## 2013-11-07 ENCOUNTER — Other Ambulatory Visit: Payer: Self-pay | Admitting: Family Medicine

## 2013-11-28 ENCOUNTER — Ambulatory Visit: Payer: Medicare Other | Admitting: Family Medicine

## 2013-12-20 ENCOUNTER — Encounter: Payer: Self-pay | Admitting: Family Medicine

## 2013-12-20 ENCOUNTER — Ambulatory Visit (INDEPENDENT_AMBULATORY_CARE_PROVIDER_SITE_OTHER): Payer: Medicare Other | Admitting: Family Medicine

## 2013-12-20 VITALS — BP 139/86 | Ht 69.0 in

## 2013-12-20 DIAGNOSIS — E785 Hyperlipidemia, unspecified: Secondary | ICD-10-CM

## 2013-12-20 DIAGNOSIS — Z125 Encounter for screening for malignant neoplasm of prostate: Secondary | ICD-10-CM

## 2013-12-20 DIAGNOSIS — Z23 Encounter for immunization: Secondary | ICD-10-CM

## 2013-12-20 DIAGNOSIS — Z79899 Other long term (current) drug therapy: Secondary | ICD-10-CM

## 2013-12-20 NOTE — Progress Notes (Signed)
   Subjective:    Patient ID: Gary Harris, male    DOB: 08/06/1950, 63 y.o.   MRN: [id number]  Hypertension This is a chronic problem. The current episode started more than 1 year ago. Risk factors for coronary artery disease include male gender. Treatments tried: norvasc, lotensin. There are no compliance problems.     following up with the pain specisalist s q three months  Every theree mo for back injection  Helps calm things down some  Cut back the kenalog pt not sure why  BP cked elsewhere numbers fine compliant with blood pressure medication. No obvious side effects.  Sinus cong this fall with the pollen  More allergy than infxn symptoms  rerlux stable and not a prob, as long as patient sticks with medication.  Patient reports his mood is stable on the Prozac.  Patient reports uses Ambien for sleep and it helped.   Pneum vaccine three four yrs ago     Review of Systems No headache no chest pain no abdominal pain and cheek in bowel habits no blood in stool no rash ROS otherwise negative    Objective:   Physical Exam Alert no acute distress blood pressure 1:30 to rate he. HEENT normal. Lungs clear. Heart rate and rhythm. Ankles without edema       Assessment & Plan:  Impression 1 hypertension good control #2 depression clinically stable #3 reflux good control #4 history of elevated lipid status uncertain. #5 chronic pain very complicated. Discussed some but patient has multiple specialists manage this along with him. Plan renew medications. Appropriate blood work. Diet exercise discussed flu vaccine. WSL recheck in 6 months

## 2013-12-24 LAB — BASIC METABOLIC PANEL
BUN: 15 mg/dL (ref 6–23)
CHLORIDE: 104 meq/L (ref 96–112)
CO2: 30 meq/L (ref 19–32)
Calcium: 9.2 mg/dL (ref 8.4–10.5)
Creat: 1.18 mg/dL (ref 0.50–1.35)
GLUCOSE: 96 mg/dL (ref 70–99)
POTASSIUM: 4.4 meq/L (ref 3.5–5.3)
SODIUM: 140 meq/L (ref 135–145)

## 2013-12-24 LAB — HEPATIC FUNCTION PANEL
ALT: 8 U/L (ref 0–53)
AST: 11 U/L (ref 0–37)
Albumin: 4.3 g/dL (ref 3.5–5.2)
Alkaline Phosphatase: 52 U/L (ref 39–117)
BILIRUBIN DIRECT: 0.1 mg/dL (ref 0.0–0.3)
BILIRUBIN INDIRECT: 0.4 mg/dL (ref 0.2–1.2)
BILIRUBIN TOTAL: 0.5 mg/dL (ref 0.2–1.2)
Total Protein: 6.5 g/dL (ref 6.0–8.3)

## 2013-12-24 LAB — LIPID PANEL
Cholesterol: 189 mg/dL (ref 0–200)
HDL: 39 mg/dL — ABNORMAL LOW (ref >39)
LDL CALC: 127 mg/dL — AB (ref 0–99)
Total CHOL/HDL Ratio: 4.8 Ratio
Triglycerides: 116 mg/dL (ref <150)
VLDL: 23 mg/dL (ref 0–40)

## 2013-12-24 LAB — PSA, MEDICARE: PSA: 0.82 ng/mL (ref <=4.00)

## 2013-12-31 ENCOUNTER — Encounter: Payer: Self-pay | Admitting: Family Medicine

## 2014-02-05 ENCOUNTER — Other Ambulatory Visit: Payer: Self-pay | Admitting: Family Medicine

## 2014-03-31 ENCOUNTER — Other Ambulatory Visit: Payer: Self-pay | Admitting: *Deleted

## 2014-03-31 ENCOUNTER — Other Ambulatory Visit: Payer: Self-pay | Admitting: Family Medicine

## 2014-04-02 DIAGNOSIS — M19072 Primary osteoarthritis, left ankle and foot: Secondary | ICD-10-CM | POA: Diagnosis not present

## 2014-04-02 DIAGNOSIS — G894 Chronic pain syndrome: Secondary | ICD-10-CM | POA: Diagnosis not present

## 2014-04-02 DIAGNOSIS — Z79899 Other long term (current) drug therapy: Secondary | ICD-10-CM | POA: Diagnosis not present

## 2014-04-02 DIAGNOSIS — Z5181 Encounter for therapeutic drug level monitoring: Secondary | ICD-10-CM | POA: Diagnosis not present

## 2014-04-02 DIAGNOSIS — M542 Cervicalgia: Secondary | ICD-10-CM | POA: Diagnosis not present

## 2014-04-02 DIAGNOSIS — M5410 Radiculopathy, site unspecified: Secondary | ICD-10-CM | POA: Diagnosis not present

## 2014-04-02 DIAGNOSIS — Z79891 Long term (current) use of opiate analgesic: Secondary | ICD-10-CM | POA: Diagnosis not present

## 2014-04-02 DIAGNOSIS — M19071 Primary osteoarthritis, right ankle and foot: Secondary | ICD-10-CM | POA: Diagnosis not present

## 2014-04-02 DIAGNOSIS — M4806 Spinal stenosis, lumbar region: Secondary | ICD-10-CM | POA: Diagnosis not present

## 2014-04-02 DIAGNOSIS — M19041 Primary osteoarthritis, right hand: Secondary | ICD-10-CM | POA: Diagnosis not present

## 2014-04-02 DIAGNOSIS — M19019 Primary osteoarthritis, unspecified shoulder: Secondary | ICD-10-CM | POA: Diagnosis not present

## 2014-04-02 DIAGNOSIS — M19042 Primary osteoarthritis, left hand: Secondary | ICD-10-CM | POA: Diagnosis not present

## 2014-04-09 ENCOUNTER — Other Ambulatory Visit: Payer: Self-pay | Admitting: *Deleted

## 2014-04-09 MED ORDER — OMEPRAZOLE 20 MG PO CPDR: 40.0000 mg | DELAYED_RELEASE_CAPSULE | Freq: Two times a day (BID) | ORAL | Status: DC

## 2014-04-23 ENCOUNTER — Encounter: Payer: Self-pay | Admitting: Family Medicine

## 2014-04-23 ENCOUNTER — Ambulatory Visit (INDEPENDENT_AMBULATORY_CARE_PROVIDER_SITE_OTHER): Payer: Medicare Other | Admitting: Family Medicine

## 2014-04-23 VITALS — BP 140/86 | Ht 69.0 in | Wt 173.6 lb

## 2014-04-23 DIAGNOSIS — K219 Gastro-esophageal reflux disease without esophagitis: Secondary | ICD-10-CM

## 2014-04-23 DIAGNOSIS — I10 Essential (primary) hypertension: Secondary | ICD-10-CM

## 2014-04-23 DIAGNOSIS — G894 Chronic pain syndrome: Secondary | ICD-10-CM

## 2014-04-23 DIAGNOSIS — G4700 Insomnia, unspecified: Secondary | ICD-10-CM | POA: Diagnosis not present

## 2014-04-23 MED ORDER — RANITIDINE HCL 150 MG PO TABS: 300.0000 mg | ORAL_TABLET | Freq: Two times a day (BID) | ORAL | Status: DC

## 2014-04-23 MED ORDER — OMEPRAZOLE 20 MG PO CPDR: 40.0000 mg | DELAYED_RELEASE_CAPSULE | Freq: Two times a day (BID) | ORAL | Status: DC

## 2014-04-23 MED ORDER — AMLODIPINE BESYLATE 10 MG PO TABS: 10.0000 mg | ORAL_TABLET | Freq: Every day | ORAL | Status: DC

## 2014-04-23 MED ORDER — BENAZEPRIL HCL 20 MG PO TABS: 20.0000 mg | ORAL_TABLET | Freq: Every day | ORAL | Status: DC

## 2014-04-23 NOTE — Progress Notes (Signed)
   Subjective:    Patient ID: Gary Harris, male    DOB: 01/06/1951, 64 y.o.   MRN: [id number]  Hypertension This is a chronic problem. The current episode started more than 1 year ago. Risk factors for coronary artery disease include male gender. Treatments tried: norvasc,lotensin. There are no compliance problems.     Chronic pain an ongoing challenge. Not exercising due to this.  Needs omeprazole take s four per day. Claims without the omeprazole plus the ranitidine has severe reflux symptoms.  Depressed mood not as much of a problem. In fact patient states he stopped his serotonin daughter.  Sleep at night, not as good, tend s to go to sleep late                                                                Wife also challenged by chronic fibromyalgia and family not getting around much these days  Review of Systems No headache no chest pain no back pain no abdominal pain no change in bowel habits    Objective:   Physical Exam  Alert no apparent distress vital stable blood pressure 132/82 lungs clear heart regular in rhythm ankles without edema      Assessment & Plan:  Impression 1 hypertension good control #2 chronic severe reflux clinically stable on meds #3 depression improved #4 insomnia ongoing discussed plan maintain all medications. Diet exercise discussed. Recheck in 6 months. Encouraged to get out into the community with volunteer work etc. if at all possible. WSL

## 2014-04-25 ENCOUNTER — Other Ambulatory Visit: Payer: Self-pay | Admitting: Family Medicine

## 2014-06-23 DIAGNOSIS — M5417 Radiculopathy, lumbosacral region: Secondary | ICD-10-CM | POA: Diagnosis not present

## 2014-06-23 DIAGNOSIS — M4806 Spinal stenosis, lumbar region: Secondary | ICD-10-CM | POA: Diagnosis not present

## 2014-06-23 DIAGNOSIS — Z79899 Other long term (current) drug therapy: Secondary | ICD-10-CM | POA: Diagnosis not present

## 2014-06-23 DIAGNOSIS — M791 Myalgia: Secondary | ICD-10-CM | POA: Diagnosis not present

## 2014-10-10 ENCOUNTER — Telehealth: Payer: Self-pay | Admitting: Family Medicine

## 2014-10-10 DIAGNOSIS — Z13220 Encounter for screening for lipoid disorders: Secondary | ICD-10-CM

## 2014-10-10 DIAGNOSIS — I10 Essential (primary) hypertension: Secondary | ICD-10-CM

## 2014-10-10 DIAGNOSIS — Z79899 Other long term (current) drug therapy: Secondary | ICD-10-CM

## 2014-10-10 NOTE — Telephone Encounter (Signed)
bw orders ready. Pt notified on voicemail.  

## 2014-10-10 NOTE — Telephone Encounter (Signed)
bw orders please, pt aware of [facility]  Last labs  12/23/13 LIP, BMP, HEP, PSA

## 2014-10-10 NOTE — Telephone Encounter (Signed)
Lipid, liver, metabolic 7 

## 2014-10-14 DIAGNOSIS — M542 Cervicalgia: Secondary | ICD-10-CM | POA: Diagnosis not present

## 2014-10-14 DIAGNOSIS — M4806 Spinal stenosis, lumbar region: Secondary | ICD-10-CM | POA: Diagnosis not present

## 2014-10-14 DIAGNOSIS — G894 Chronic pain syndrome: Secondary | ICD-10-CM | POA: Diagnosis not present

## 2014-10-14 DIAGNOSIS — Z79899 Other long term (current) drug therapy: Secondary | ICD-10-CM | POA: Diagnosis not present

## 2014-10-21 DIAGNOSIS — Z13220 Encounter for screening for lipoid disorders: Secondary | ICD-10-CM | POA: Diagnosis not present

## 2014-10-21 DIAGNOSIS — I10 Essential (primary) hypertension: Secondary | ICD-10-CM | POA: Diagnosis not present

## 2014-10-21 DIAGNOSIS — Z79899 Other long term (current) drug therapy: Secondary | ICD-10-CM | POA: Diagnosis not present

## 2014-10-22 LAB — HEPATIC FUNCTION PANEL
ALT: 9 IU/L (ref 0–44)
AST: 10 IU/L (ref 0–40)
Albumin: 4.3 g/dL (ref 3.6–4.8)
Alkaline Phosphatase: 53 IU/L (ref 39–117)
BILIRUBIN TOTAL: 0.5 mg/dL (ref 0.0–1.2)
Bilirubin, Direct: 0.13 mg/dL (ref 0.00–0.40)
Total Protein: 6.6 g/dL (ref 6.0–8.5)

## 2014-10-22 LAB — LIPID PANEL
CHOLESTEROL TOTAL: 192 mg/dL (ref 100–199)
Chol/HDL Ratio: 5.3 ratio units — ABNORMAL HIGH (ref 0.0–5.0)
HDL: 36 mg/dL — ABNORMAL LOW (ref >39)
LDL Calculated: 122 mg/dL — ABNORMAL HIGH (ref 0–99)
TRIGLYCERIDES: 170 mg/dL — AB (ref 0–149)
VLDL Cholesterol Cal: 34 mg/dL (ref 5–40)

## 2014-10-22 LAB — BASIC METABOLIC PANEL
BUN / CREAT RATIO: 10 (ref 10–22)
BUN: 10 mg/dL (ref 8–27)
CHLORIDE: 104 mmol/L (ref 97–108)
CO2: 23 mmol/L (ref 18–29)
Calcium: 8.9 mg/dL (ref 8.6–10.2)
Creatinine, Ser: 0.96 mg/dL (ref 0.76–1.27)
GFR calc Af Amer: 98 mL/min/{1.73_m2} (ref >59)
GFR, EST NON AFRICAN AMERICAN: 84 mL/min/{1.73_m2} (ref >59)
Glucose: 101 mg/dL — ABNORMAL HIGH (ref 65–99)
Potassium: 4.1 mmol/L (ref 3.5–5.2)
Sodium: 143 mmol/L (ref 134–144)

## 2014-10-24 ENCOUNTER — Ambulatory Visit (INDEPENDENT_AMBULATORY_CARE_PROVIDER_SITE_OTHER): Payer: Medicare Other | Admitting: Family Medicine

## 2014-10-24 ENCOUNTER — Encounter: Payer: Self-pay | Admitting: Family Medicine

## 2014-10-24 VITALS — BP 130/82 | Ht 69.0 in | Wt 168.8 lb

## 2014-10-24 DIAGNOSIS — I10 Essential (primary) hypertension: Secondary | ICD-10-CM

## 2014-10-24 DIAGNOSIS — K219 Gastro-esophageal reflux disease without esophagitis: Secondary | ICD-10-CM | POA: Diagnosis not present

## 2014-10-24 DIAGNOSIS — G4700 Insomnia, unspecified: Secondary | ICD-10-CM

## 2014-10-24 MED ORDER — AMLODIPINE BESYLATE 10 MG PO TABS: 10.0000 mg | ORAL_TABLET | Freq: Every day | ORAL | Status: DC

## 2014-10-24 MED ORDER — BENAZEPRIL HCL 20 MG PO TABS: 20.0000 mg | ORAL_TABLET | Freq: Every day | ORAL | Status: DC

## 2014-10-24 MED ORDER — TIZANIDINE HCL 4 MG PO TABS: 4.0000 mg | ORAL_TABLET | Freq: Four times a day (QID) | ORAL | Status: DC | PRN

## 2014-10-24 NOTE — Progress Notes (Signed)
   Subjective:    Patient ID: Gary Harris, male    DOB: [birth year], 64 y.o.   MRN: [id number]  Hypertension This is a chronic problem. The current episode started more than 1 year ago. The problem has been gradually improving since onset. There are no associated agents to hypertension. There are no known risk factors for coronary artery disease. Treatments tried: amlodipine, benazapril. The current treatment provides moderate improvement. There are no compliance problems.     Also sees chronic pain specialist  Low back pain persistent  Flares up at times  Patient would also like to discuss back pain. Patient notes spasm-like pain at times. 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  Still uses Ambien regularly. States it definitely helps him with sleep. States she definitely needs to stay on it.  Still on the omeprazole. Without it has severe reflux symptoms. Needs current dose.  Review of Systems No headache no chest pain no back pain no abdominal pain no change in bowel habits no blood in stool    Objective:   Physical Exam Alert vitals stable blood pressure good on repeat HEENT sinus congestion lungs clear heart rhythm low abdomen nontender. Lumbar region some tenderness to palpation       Assessment & Plan:  Impression 1 hypertension good control #2 insomnia ongoing #3 reflux ongoing needing medication. #4 low back spasms discussed #5 depression stable per patient plan medications refilled. Diet exercise discussed. Add Zanaflex when necessary. Follow-up as scheduled. WSL

## 2014-10-24 NOTE — Patient Instructions (Signed)

## 2014-11-05 DIAGNOSIS — M4806 Spinal stenosis, lumbar region: Secondary | ICD-10-CM | POA: Diagnosis not present

## 2014-11-05 DIAGNOSIS — M5137 Other intervertebral disc degeneration, lumbosacral region: Secondary | ICD-10-CM | POA: Diagnosis not present

## 2015-01-14 DIAGNOSIS — M542 Cervicalgia: Secondary | ICD-10-CM | POA: Diagnosis not present

## 2015-01-14 DIAGNOSIS — Z79891 Long term (current) use of opiate analgesic: Secondary | ICD-10-CM | POA: Diagnosis not present

## 2015-01-14 DIAGNOSIS — M4806 Spinal stenosis, lumbar region: Secondary | ICD-10-CM | POA: Diagnosis not present

## 2015-01-14 DIAGNOSIS — G894 Chronic pain syndrome: Secondary | ICD-10-CM | POA: Diagnosis not present

## 2015-02-02 DIAGNOSIS — Z029 Encounter for administrative examinations, unspecified: Secondary | ICD-10-CM

## 2015-04-13 DIAGNOSIS — M5410 Radiculopathy, site unspecified: Secondary | ICD-10-CM | POA: Diagnosis not present

## 2015-04-13 DIAGNOSIS — M19071 Primary osteoarthritis, right ankle and foot: Secondary | ICD-10-CM | POA: Diagnosis not present

## 2015-04-13 DIAGNOSIS — M4806 Spinal stenosis, lumbar region: Secondary | ICD-10-CM | POA: Diagnosis not present

## 2015-04-13 DIAGNOSIS — H547 Unspecified visual loss: Secondary | ICD-10-CM | POA: Diagnosis not present

## 2015-04-13 DIAGNOSIS — G894 Chronic pain syndrome: Secondary | ICD-10-CM | POA: Diagnosis not present

## 2015-04-13 DIAGNOSIS — Z791 Long term (current) use of non-steroidal anti-inflammatories (NSAID): Secondary | ICD-10-CM | POA: Diagnosis not present

## 2015-04-13 DIAGNOSIS — Z79899 Other long term (current) drug therapy: Secondary | ICD-10-CM | POA: Diagnosis not present

## 2015-04-13 DIAGNOSIS — M19011 Primary osteoarthritis, right shoulder: Secondary | ICD-10-CM | POA: Diagnosis not present

## 2015-04-13 DIAGNOSIS — K5900 Constipation, unspecified: Secondary | ICD-10-CM | POA: Diagnosis not present

## 2015-04-13 DIAGNOSIS — Z760 Encounter for issue of repeat prescription: Secondary | ICD-10-CM | POA: Diagnosis not present

## 2015-04-13 DIAGNOSIS — Z79891 Long term (current) use of opiate analgesic: Secondary | ICD-10-CM | POA: Diagnosis not present

## 2015-04-13 DIAGNOSIS — M19012 Primary osteoarthritis, left shoulder: Secondary | ICD-10-CM | POA: Diagnosis not present

## 2015-04-13 DIAGNOSIS — M542 Cervicalgia: Secondary | ICD-10-CM | POA: Diagnosis not present

## 2015-04-13 DIAGNOSIS — M19072 Primary osteoarthritis, left ankle and foot: Secondary | ICD-10-CM | POA: Diagnosis not present

## 2015-04-13 DIAGNOSIS — Z888 Allergy status to other drugs, medicaments and biological substances status: Secondary | ICD-10-CM | POA: Diagnosis not present

## 2015-04-13 DIAGNOSIS — Z881 Allergy status to other antibiotic agents status: Secondary | ICD-10-CM | POA: Diagnosis not present

## 2015-04-28 ENCOUNTER — Ambulatory Visit (INDEPENDENT_AMBULATORY_CARE_PROVIDER_SITE_OTHER): Payer: Medicare Other | Admitting: Family Medicine

## 2015-04-28 ENCOUNTER — Encounter: Payer: Self-pay | Admitting: Family Medicine

## 2015-04-28 VITALS — BP 130/82 | Ht 69.0 in | Wt 175.8 lb

## 2015-04-28 DIAGNOSIS — D649 Anemia, unspecified: Secondary | ICD-10-CM | POA: Diagnosis not present

## 2015-04-28 DIAGNOSIS — G4700 Insomnia, unspecified: Secondary | ICD-10-CM | POA: Diagnosis not present

## 2015-04-28 DIAGNOSIS — I10 Essential (primary) hypertension: Secondary | ICD-10-CM | POA: Diagnosis not present

## 2015-04-28 DIAGNOSIS — F329 Major depressive disorder, single episode, unspecified: Secondary | ICD-10-CM

## 2015-04-28 DIAGNOSIS — F32A Depression, unspecified: Secondary | ICD-10-CM

## 2015-04-28 LAB — POCT HEMOGLOBIN: HEMOGLOBIN: 12.5 g/dL — AB (ref 14.1–18.1)

## 2015-04-28 MED ORDER — BENAZEPRIL HCL 20 MG PO TABS: 20.0000 mg | ORAL_TABLET | Freq: Every day | ORAL | Status: DC

## 2015-04-28 MED ORDER — KETOCONAZOLE 2 % EX CREA: 1.0000 "application " | TOPICAL_CREAM | Freq: Two times a day (BID) | CUTANEOUS | Status: DC

## 2015-04-28 MED ORDER — AMLODIPINE BESYLATE 10 MG PO TABS: 10.0000 mg | ORAL_TABLET | Freq: Every day | ORAL | Status: DC

## 2015-04-28 NOTE — Progress Notes (Signed)
   Subjective:    Patient ID: Gary Harris, male    DOB: [birth year], 65 y.o.   MRN: [id number]  Hypertension This is a chronic problem. The current episode started more than 1 year ago. Risk factors for coronary artery disease include male gender. Treatments tried: norvasc, lotensin. There are no compliance problems.    Compliant with bp meds, no obvious side effects. Does not miss a dose.  , no side effects  Cataracts soon,  Uses ambien only prn. States definitely needs refills with the. Helps him for sleep.  Morphine plus oxycodone   prozac mood, uses more episodic, Overall prn approach helps pt, patient states definitely needs refills on Prozac  Back sore at time, no sig   Patient states last colonoscopy was about 10 years ago but was told he did not need one for 15 years by GI doctor  History of anemia. Requests a hemoglobin. See prior notes.   Review of Systems No headache no chest pain no shortness breath no abdominal pain positive chronic back pain positive chronic foot pain    Objective:   Physical Exam  Alert no acute distress. Vitals stable. Blood pressure good on repeat HEENT normal lungs clear heart rare rhythm ankles without edema      Assessment & Plan:  Impression 1 depression clinically stable #2 insomnia ongoing need for meds discussed him or 3 hypertension good control patient maintain same meds meds refilled #4 history of anemia. Hemoglobin borderline low today. We'll recheck CBC and ferritin, further recommendations based on results plan appropriate blood work. Blood pressure medicine a few. Insomnia medicine refilled. Impression medicine refilled. Diet exercise discussed. Check every 6 months. WSL

## 2015-04-29 LAB — CBC WITH DIFFERENTIAL/PLATELET
BASOS ABS: 0.1 10*3/uL (ref 0.0–0.2)
Basos: 1 %
EOS (ABSOLUTE): 0.5 10*3/uL — ABNORMAL HIGH (ref 0.0–0.4)
EOS: 5 %
HEMATOCRIT: 35.5 % — AB (ref 37.5–51.0)
HEMOGLOBIN: 12.1 g/dL — AB (ref 12.6–17.7)
IMMATURE GRANS (ABS): 0 10*3/uL (ref 0.0–0.1)
Immature Granulocytes: 0 %
LYMPHS ABS: 2.8 10*3/uL (ref 0.7–3.1)
LYMPHS: 29 %
MCH: 29.3 pg (ref 26.6–33.0)
MCHC: 34.1 g/dL (ref 31.5–35.7)
MCV: 86 fL (ref 79–97)
MONOCYTES: 7 %
Monocytes Absolute: 0.6 10*3/uL (ref 0.1–0.9)
NEUTROS ABS: 5.6 10*3/uL (ref 1.4–7.0)
Neutrophils: 58 %
Platelets: 285 10*3/uL (ref 150–379)
RBC: 4.13 x10E6/uL — AB (ref 4.14–5.80)
RDW: 13.6 % (ref 12.3–15.4)
WBC: 9.6 10*3/uL (ref 3.4–10.8)

## 2015-04-29 LAB — FERRITIN: FERRITIN: 17 ng/mL — AB (ref 30–400)

## 2015-05-03 DIAGNOSIS — F32A Depression, unspecified: Secondary | ICD-10-CM | POA: Insufficient documentation

## 2015-05-03 DIAGNOSIS — F329 Major depressive disorder, single episode, unspecified: Secondary | ICD-10-CM | POA: Insufficient documentation

## 2015-05-11 NOTE — Addendum Note (Signed)
Addended by: Metro Kung on: 05/11/2015 09:09 AM   Modules accepted: Orders

## 2015-05-15 DIAGNOSIS — H268 Other specified cataract: Secondary | ICD-10-CM | POA: Diagnosis not present

## 2015-05-15 DIAGNOSIS — IMO0002 Reserved for concepts with insufficient information to code with codable children: Secondary | ICD-10-CM | POA: Insufficient documentation

## 2015-05-15 DIAGNOSIS — H35371 Puckering of macula, right eye: Secondary | ICD-10-CM | POA: Diagnosis not present

## 2015-05-15 DIAGNOSIS — H3554 Dystrophies primarily involving the retinal pigment epithelium: Secondary | ICD-10-CM | POA: Diagnosis not present

## 2015-05-15 DIAGNOSIS — H5213 Myopia, bilateral: Secondary | ICD-10-CM | POA: Diagnosis not present

## 2015-06-19 ENCOUNTER — Other Ambulatory Visit: Payer: Self-pay

## 2015-06-19 DIAGNOSIS — D649 Anemia, unspecified: Secondary | ICD-10-CM

## 2015-06-19 LAB — POC HEMOCCULT BLD/STL (HOME/3-CARD/SCREEN)
Card #2 Fecal Occult Blod, POC: NEGATIVE
FECAL OCCULT BLD: NEGATIVE
FECAL OCCULT BLD: NEGATIVE

## 2015-07-06 ENCOUNTER — Other Ambulatory Visit: Payer: Self-pay | Admitting: Family Medicine

## 2015-07-09 DIAGNOSIS — G894 Chronic pain syndrome: Secondary | ICD-10-CM | POA: Diagnosis not present

## 2015-07-09 DIAGNOSIS — M4806 Spinal stenosis, lumbar region: Secondary | ICD-10-CM | POA: Diagnosis not present

## 2015-07-09 DIAGNOSIS — Z79891 Long term (current) use of opiate analgesic: Secondary | ICD-10-CM | POA: Diagnosis not present

## 2015-07-09 DIAGNOSIS — M542 Cervicalgia: Secondary | ICD-10-CM | POA: Diagnosis not present

## 2015-07-23 ENCOUNTER — Ambulatory Visit (INDEPENDENT_AMBULATORY_CARE_PROVIDER_SITE_OTHER): Payer: Medicare Other | Admitting: Family Medicine

## 2015-07-23 ENCOUNTER — Encounter: Payer: Self-pay | Admitting: Family Medicine

## 2015-07-23 VITALS — Temp 98.3°F | Ht 69.0 in | Wt 174.8 lb

## 2015-07-23 DIAGNOSIS — R21 Rash and other nonspecific skin eruption: Secondary | ICD-10-CM | POA: Diagnosis not present

## 2015-07-23 NOTE — Progress Notes (Signed)
   Subjective:    Patient ID: Gary Harris, male    DOB: 04/08/1950, 65 y.o.   MRN: [id number]  HPI Patient arrives with c/o tick bite on upper left thigh-patient has been unable to remove the head. Patient thinks the tick was likely embedded for 24 hours.  No systemic features no headache no fever no rash elsewhere.  Wife present and very anxious about his tick bite.    Review of Systems No headache, no major weight loss or weight gain, no chest pain no back pain abdominal pain no change in bowel habits complete ROS otherwise negative     Objective:   Physical Exam Alert vital stable lungs clear heart rhythm H&T normal anterior thigh small head of tick present grass with fine forceps is close to the skin as possible study pressure removed all but a very tiny little bit of likely mouthparts. Does not warrant surgery intervention       Assessment & Plan:  Impression tick bite discuss expect slight inflammatory reaction with reabsorption of mouthparts which is a common occurrence warning signs discussed regarding potential infection

## 2015-08-12 DIAGNOSIS — R011 Cardiac murmur, unspecified: Secondary | ICD-10-CM | POA: Diagnosis not present

## 2015-08-12 DIAGNOSIS — D509 Iron deficiency anemia, unspecified: Secondary | ICD-10-CM | POA: Diagnosis not present

## 2015-08-12 DIAGNOSIS — K5900 Constipation, unspecified: Secondary | ICD-10-CM | POA: Diagnosis not present

## 2015-10-01 ENCOUNTER — Other Ambulatory Visit: Payer: Self-pay | Admitting: Family Medicine

## 2015-10-06 DIAGNOSIS — Z79891 Long term (current) use of opiate analgesic: Secondary | ICD-10-CM | POA: Diagnosis not present

## 2015-10-06 DIAGNOSIS — M4806 Spinal stenosis, lumbar region: Secondary | ICD-10-CM | POA: Diagnosis not present

## 2015-10-06 DIAGNOSIS — G894 Chronic pain syndrome: Secondary | ICD-10-CM | POA: Diagnosis not present

## 2015-10-06 DIAGNOSIS — M542 Cervicalgia: Secondary | ICD-10-CM | POA: Diagnosis not present

## 2015-10-09 ENCOUNTER — Other Ambulatory Visit: Payer: Self-pay | Admitting: Family Medicine

## 2015-11-23 ENCOUNTER — Other Ambulatory Visit: Payer: Self-pay | Admitting: Family Medicine

## 2016-01-03 ENCOUNTER — Other Ambulatory Visit: Payer: Self-pay | Admitting: Family Medicine

## 2016-01-11 DIAGNOSIS — Z79891 Long term (current) use of opiate analgesic: Secondary | ICD-10-CM | POA: Diagnosis not present

## 2016-01-11 DIAGNOSIS — G894 Chronic pain syndrome: Secondary | ICD-10-CM | POA: Diagnosis not present

## 2016-01-11 DIAGNOSIS — M542 Cervicalgia: Secondary | ICD-10-CM | POA: Diagnosis not present

## 2016-01-11 DIAGNOSIS — M48061 Spinal stenosis, lumbar region without neurogenic claudication: Secondary | ICD-10-CM | POA: Diagnosis not present

## 2016-03-28 DIAGNOSIS — M19012 Primary osteoarthritis, left shoulder: Secondary | ICD-10-CM | POA: Diagnosis not present

## 2016-03-28 DIAGNOSIS — M19011 Primary osteoarthritis, right shoulder: Secondary | ICD-10-CM | POA: Diagnosis not present

## 2016-03-28 DIAGNOSIS — M5410 Radiculopathy, site unspecified: Secondary | ICD-10-CM | POA: Diagnosis not present

## 2016-03-28 DIAGNOSIS — M19072 Primary osteoarthritis, left ankle and foot: Secondary | ICD-10-CM | POA: Diagnosis not present

## 2016-03-28 DIAGNOSIS — G894 Chronic pain syndrome: Secondary | ICD-10-CM | POA: Diagnosis not present

## 2016-03-28 DIAGNOSIS — M542 Cervicalgia: Secondary | ICD-10-CM | POA: Diagnosis not present

## 2016-03-28 DIAGNOSIS — M48061 Spinal stenosis, lumbar region without neurogenic claudication: Secondary | ICD-10-CM | POA: Diagnosis not present

## 2016-03-28 DIAGNOSIS — Z79891 Long term (current) use of opiate analgesic: Secondary | ICD-10-CM | POA: Diagnosis not present

## 2016-03-28 DIAGNOSIS — M4800 Spinal stenosis, site unspecified: Secondary | ICD-10-CM | POA: Diagnosis not present

## 2016-03-28 DIAGNOSIS — M19071 Primary osteoarthritis, right ankle and foot: Secondary | ICD-10-CM | POA: Diagnosis not present

## 2016-04-30 ENCOUNTER — Other Ambulatory Visit: Payer: Self-pay | Admitting: Family Medicine

## 2016-05-02 NOTE — Telephone Encounter (Signed)
Since avail otc six mo worth, if rejected or insur insists on pre approval will need ov

## 2016-05-02 NOTE — Telephone Encounter (Signed)
Last check up > 1 year ago

## 2016-05-27 ENCOUNTER — Other Ambulatory Visit: Payer: Self-pay | Admitting: Family Medicine

## 2016-06-17 ENCOUNTER — Other Ambulatory Visit: Payer: Self-pay | Admitting: *Deleted

## 2016-06-17 MED ORDER — BENAZEPRIL HCL 20 MG PO TABS: 20.0000 mg | ORAL_TABLET | Freq: Every day | ORAL | 0 refills | Status: DC

## 2016-06-20 ENCOUNTER — Other Ambulatory Visit: Payer: Self-pay

## 2016-06-20 MED ORDER — BENAZEPRIL HCL 20 MG PO TABS: 20.0000 mg | ORAL_TABLET | Freq: Every day | ORAL | 5 refills | Status: DC

## 2016-06-27 ENCOUNTER — Ambulatory Visit (INDEPENDENT_AMBULATORY_CARE_PROVIDER_SITE_OTHER): Payer: Medicare Other | Admitting: Family Medicine

## 2016-06-27 ENCOUNTER — Encounter: Payer: Self-pay | Admitting: Family Medicine

## 2016-06-27 VITALS — BP 110/74 | Ht 69.0 in | Wt 163.0 lb

## 2016-06-27 DIAGNOSIS — I10 Essential (primary) hypertension: Secondary | ICD-10-CM | POA: Diagnosis not present

## 2016-06-27 DIAGNOSIS — K219 Gastro-esophageal reflux disease without esophagitis: Secondary | ICD-10-CM | POA: Diagnosis not present

## 2016-06-27 DIAGNOSIS — F321 Major depressive disorder, single episode, moderate: Secondary | ICD-10-CM

## 2016-06-27 DIAGNOSIS — Z125 Encounter for screening for malignant neoplasm of prostate: Secondary | ICD-10-CM | POA: Diagnosis not present

## 2016-06-27 DIAGNOSIS — Z13220 Encounter for screening for lipoid disorders: Secondary | ICD-10-CM

## 2016-06-27 DIAGNOSIS — Z79899 Other long term (current) drug therapy: Secondary | ICD-10-CM | POA: Diagnosis not present

## 2016-06-27 DIAGNOSIS — F5101 Primary insomnia: Secondary | ICD-10-CM

## 2016-06-27 MED ORDER — BENAZEPRIL HCL 20 MG PO TABS: 20.0000 mg | ORAL_TABLET | Freq: Every day | ORAL | 1 refills | Status: DC

## 2016-06-27 MED ORDER — AMLODIPINE BESYLATE 10 MG PO TABS: 10.0000 mg | ORAL_TABLET | Freq: Every day | ORAL | 1 refills | Status: DC

## 2016-06-27 NOTE — Progress Notes (Signed)
   Subjective:    Patient ID: Gary Harris, male    DOB: 1950/12/06, 66 y.o.   MRN: [id number]  Hypertension  This is a chronic problem. The current episode started more than 1 year ago. Compliance problems include exercise.   Blood pressure medicine and blood pressure levels reviewed today with patient. Compliant with blood pressure medicine. States does not miss a dose. No obvious side effects. Blood pressure generally good when checked elsewhere. Watching salt intake.  Patient compliant with insomnia medication. Generally takes most nights. No obvious morning drowsiness. Definitely helps patient sleep. Without it patient states would not get a good nights rest.  Trying to walk some,  dhallenges self to exrcise   Patient notes ongoing compliance with antidepressant medication. No obvious side effects. Reports does not miss a dose. Overall continues to help depression substantially. No thoughts of homicide or suicide. Would like to maintain medication.  usinf nerve stimulator external and getting help from it    Wants to do stool test for colon cancer. Has not been able to do colonoscopy.  Pt was unable to do had one set up, but it was cancelled  Last on A little pain in right first finger. Starting to curve some.  Right handed  Little stiff at times  Tighter joint    Review of Systems No headache, no major weight loss or weight gain, no chest pain no back pain abdominal pain no change in bowel habits complete ROS otherwise negative     Objective:   Physical Exam Alert and oriented, vitals reviewed and stable, NAD ENT-TM's and ext canals WNL bilat via otoscopic exam Soft palate, tonsils and post pharynx WNL via oropharyngeal exam Neck-symmetric, no masses; thyroid nonpalpable and nontender Pulmonary-no tachypnea or accessory muscle use; Clear without wheezes via auscultation Card--no abnrml murmurs, rhythm reg and rate WNL Carotid pulses symmetric, without bruits Hand  reveals Heberden's nodes with trace deviation       Assessment & Plan:  Impression 1 hypertension good control discussed maintain same meds #2 arthritis discuss osteoarthritis. Not rheumatoid arthritis or 3 insomnia ongoing with need for meds discussed #4 depression clinically stable. Uses Prozac. But often uses it on a when necessary basis plan appropriate blood work. All medications refilled. Diet exercise discussed. Further recommendations based on blood work

## 2016-06-29 DIAGNOSIS — Z79891 Long term (current) use of opiate analgesic: Secondary | ICD-10-CM | POA: Diagnosis not present

## 2016-06-29 DIAGNOSIS — M542 Cervicalgia: Secondary | ICD-10-CM | POA: Diagnosis not present

## 2016-06-29 DIAGNOSIS — M48061 Spinal stenosis, lumbar region without neurogenic claudication: Secondary | ICD-10-CM | POA: Diagnosis not present

## 2016-06-29 DIAGNOSIS — G894 Chronic pain syndrome: Secondary | ICD-10-CM | POA: Diagnosis not present

## 2016-08-04 DIAGNOSIS — Z79899 Other long term (current) drug therapy: Secondary | ICD-10-CM | POA: Diagnosis not present

## 2016-08-04 DIAGNOSIS — Z13220 Encounter for screening for lipoid disorders: Secondary | ICD-10-CM | POA: Diagnosis not present

## 2016-08-04 DIAGNOSIS — Z125 Encounter for screening for malignant neoplasm of prostate: Secondary | ICD-10-CM | POA: Diagnosis not present

## 2016-08-04 DIAGNOSIS — I10 Essential (primary) hypertension: Secondary | ICD-10-CM | POA: Diagnosis not present

## 2016-08-05 LAB — HEPATIC FUNCTION PANEL
ALBUMIN: 4.4 g/dL (ref 3.6–4.8)
ALT: 13 IU/L (ref 0–44)
AST: 17 IU/L (ref 0–40)
Alkaline Phosphatase: 44 IU/L (ref 39–117)
BILIRUBIN TOTAL: 0.6 mg/dL (ref 0.0–1.2)
Bilirubin, Direct: 0.15 mg/dL (ref 0.00–0.40)
TOTAL PROTEIN: 7.2 g/dL (ref 6.0–8.5)

## 2016-08-05 LAB — LIPID PANEL
CHOL/HDL RATIO: 4.9 ratio (ref 0.0–5.0)
Cholesterol, Total: 193 mg/dL (ref 100–199)
HDL: 39 mg/dL — ABNORMAL LOW (ref >39)
LDL CALC: 109 mg/dL — AB (ref 0–99)
Triglycerides: 223 mg/dL — ABNORMAL HIGH (ref 0–149)
VLDL Cholesterol Cal: 45 mg/dL — ABNORMAL HIGH (ref 5–40)

## 2016-08-05 LAB — BASIC METABOLIC PANEL
BUN / CREAT RATIO: 11 (ref 10–24)
BUN: 12 mg/dL (ref 8–27)
CALCIUM: 9.2 mg/dL (ref 8.6–10.2)
CHLORIDE: 100 mmol/L (ref 96–106)
CO2: 25 mmol/L (ref 18–29)
CREATININE: 1.1 mg/dL (ref 0.76–1.27)
GFR, EST AFRICAN AMERICAN: 82 mL/min/{1.73_m2} (ref >59)
GFR, EST NON AFRICAN AMERICAN: 71 mL/min/{1.73_m2} (ref >59)
Glucose: 94 mg/dL (ref 65–99)
Potassium: 4.4 mmol/L (ref 3.5–5.2)
Sodium: 141 mmol/L (ref 134–144)

## 2016-08-05 LAB — PSA: Prostate Specific Ag, Serum: 1.6 ng/mL (ref 0.0–4.0)

## 2016-08-07 ENCOUNTER — Encounter: Payer: Self-pay | Admitting: Family Medicine

## 2016-09-29 DIAGNOSIS — Z79891 Long term (current) use of opiate analgesic: Secondary | ICD-10-CM | POA: Diagnosis not present

## 2016-09-29 DIAGNOSIS — M48061 Spinal stenosis, lumbar region without neurogenic claudication: Secondary | ICD-10-CM | POA: Diagnosis not present

## 2016-09-29 DIAGNOSIS — M542 Cervicalgia: Secondary | ICD-10-CM | POA: Diagnosis not present

## 2016-09-29 DIAGNOSIS — G894 Chronic pain syndrome: Secondary | ICD-10-CM | POA: Diagnosis not present

## 2016-11-26 ENCOUNTER — Other Ambulatory Visit: Payer: Self-pay | Admitting: Family Medicine

## 2017-01-03 ENCOUNTER — Other Ambulatory Visit: Payer: Self-pay | Admitting: Family Medicine

## 2017-01-03 DIAGNOSIS — G894 Chronic pain syndrome: Secondary | ICD-10-CM | POA: Diagnosis not present

## 2017-01-03 DIAGNOSIS — Z79891 Long term (current) use of opiate analgesic: Secondary | ICD-10-CM | POA: Diagnosis not present

## 2017-01-03 DIAGNOSIS — M542 Cervicalgia: Secondary | ICD-10-CM | POA: Diagnosis not present

## 2017-01-03 DIAGNOSIS — M48061 Spinal stenosis, lumbar region without neurogenic claudication: Secondary | ICD-10-CM | POA: Diagnosis not present

## 2017-01-12 ENCOUNTER — Ambulatory Visit (INDEPENDENT_AMBULATORY_CARE_PROVIDER_SITE_OTHER): Payer: Medicare Other | Admitting: Family Medicine

## 2017-01-12 ENCOUNTER — Encounter: Payer: Self-pay | Admitting: Family Medicine

## 2017-01-12 VITALS — BP 124/60 | Ht 65.25 in | Wt 158.0 lb

## 2017-01-12 DIAGNOSIS — Z0000 Encounter for general adult medical examination without abnormal findings: Secondary | ICD-10-CM | POA: Diagnosis not present

## 2017-01-12 DIAGNOSIS — F5101 Primary insomnia: Secondary | ICD-10-CM

## 2017-01-12 DIAGNOSIS — I10 Essential (primary) hypertension: Secondary | ICD-10-CM | POA: Diagnosis not present

## 2017-01-12 DIAGNOSIS — Z23 Encounter for immunization: Secondary | ICD-10-CM | POA: Diagnosis not present

## 2017-01-12 DIAGNOSIS — F321 Major depressive disorder, single episode, moderate: Secondary | ICD-10-CM | POA: Diagnosis not present

## 2017-01-12 NOTE — Progress Notes (Signed)
Subjective:    Patient ID: Gary Harris, male    DOB: 08/16/1950, 66 y.o.   MRN: [id number]  HPI The patient comes in today for a wellness visit.    A review of their health history was completed.  A review of medications was also completed.  Any needed refills; none  Eating habits: health conscious  Falls/  MVA accidents in past few months: none  Regular exercise: walking  Specialist pt sees on regular basis: specialist at [facility] for pain med  Preventative health issues were discussed.   Additional concerns: flu vaccine.   Blood pressure medicine and blood pressure levels reviewed today with patient. Compliant with blood pressure medicine. States does not miss a dose. No obvious side effects. Blood pressure generally good when checked elsewhere. Watching salt intake.  Patient notes ongoing compliance with antidepressant medication. No obvious side effects. Reports does not miss a dose. Overall continues to help depression substantially. No thoughts of homicide or suicide. Would like to maintain medication.  Was unable to do colonoscopy due to stress of mother's illness  Due flu shot today    . Pos use f antidepr prn works well for pt     Patient compliant with insomnia medication. Generally takes most nights. No obvious morning drowsiness. Definitely helps patient sleep. Without it patient states would not get a good nights rest. Overall good with sleep med  Some cong this l l   Review of Systems  Constitutional: Negative for activity change, appetite change and fever.  HENT: Negative for congestion and rhinorrhea.   Eyes: Negative for discharge.  Respiratory: Negative for cough and wheezing.   Cardiovascular: Negative for chest pain.  Gastrointestinal: Negative for abdominal pain, blood in stool and vomiting.  Genitourinary: Negative for difficulty urinating and frequency.  Musculoskeletal: Negative for neck pain.  Skin: Negative for rash.    Allergic/Immunologic: Negative for environmental allergies and food allergies.  Neurological: Negative for weakness and headaches.  Psychiatric/Behavioral: Negative for agitation.  All other systems reviewed and are negative.      Objective:   Physical Exam  Constitutional: He appears well-developed and well-nourished.  HENT:  Head: Normocephalic and atraumatic.  Right Ear: External ear normal.  Left Ear: External ear normal.  Nose: Nose normal.  Mouth/Throat: Oropharynx is clear and moist.  Eyes: Pupils are equal, round, and reactive to light. EOM are normal.  Neck: Normal range of motion. Neck supple. No thyromegaly present.  Cardiovascular: Normal rate, regular rhythm and normal heart sounds.   No murmur heard. Pulmonary/Chest: Effort normal and breath sounds normal. No respiratory distress. He has no wheezes.  Abdominal: Soft. Bowel sounds are normal. He exhibits no distension and no mass. There is no tenderness.  Genitourinary: Penis normal.  Musculoskeletal: Normal range of motion. He exhibits no edema.  Lymphadenopathy:    He has no cervical adenopathy.  Neurological: He is alert. He exhibits normal muscle tone.  Skin: Skin is warm and dry. No erythema.  Psychiatric: He has a normal mood and affect. His behavior is normal. Judgment normal.  Vitals reviewed.         Assessment & Plan:  Impression wellness exam.  Diet discussed.  Exercise discussed.  Vaccines discussed.  And administered colonoscopy discussed.  Patient continues to find reasons/excuses not to do it I encouraged him to make it happen.  2.  Insomnia ongoing for need for medicines discussed we will renew  3.  Depression clinically stable no suicidal thoughts will maintain  meds  4.  Hypertension clinically stable discussed to maintain the same

## 2017-03-29 DIAGNOSIS — M25542 Pain in joints of left hand: Secondary | ICD-10-CM | POA: Diagnosis not present

## 2017-03-29 DIAGNOSIS — Z79891 Long term (current) use of opiate analgesic: Secondary | ICD-10-CM | POA: Diagnosis not present

## 2017-03-29 DIAGNOSIS — M79602 Pain in left arm: Secondary | ICD-10-CM | POA: Diagnosis not present

## 2017-03-29 DIAGNOSIS — M25571 Pain in right ankle and joints of right foot: Secondary | ICD-10-CM | POA: Diagnosis not present

## 2017-03-29 DIAGNOSIS — M25561 Pain in right knee: Secondary | ICD-10-CM | POA: Diagnosis not present

## 2017-03-29 DIAGNOSIS — M79605 Pain in left leg: Secondary | ICD-10-CM | POA: Diagnosis not present

## 2017-03-29 DIAGNOSIS — G894 Chronic pain syndrome: Secondary | ICD-10-CM | POA: Diagnosis not present

## 2017-03-29 DIAGNOSIS — Z881 Allergy status to other antibiotic agents status: Secondary | ICD-10-CM | POA: Diagnosis not present

## 2017-03-29 DIAGNOSIS — M25512 Pain in left shoulder: Secondary | ICD-10-CM | POA: Diagnosis not present

## 2017-03-29 DIAGNOSIS — M25541 Pain in joints of right hand: Secondary | ICD-10-CM | POA: Diagnosis not present

## 2017-03-29 DIAGNOSIS — M79604 Pain in right leg: Secondary | ICD-10-CM | POA: Diagnosis not present

## 2017-03-29 DIAGNOSIS — Z888 Allergy status to other drugs, medicaments and biological substances status: Secondary | ICD-10-CM | POA: Diagnosis not present

## 2017-03-29 DIAGNOSIS — M79601 Pain in right arm: Secondary | ICD-10-CM | POA: Diagnosis not present

## 2017-03-29 DIAGNOSIS — M25572 Pain in left ankle and joints of left foot: Secondary | ICD-10-CM | POA: Diagnosis not present

## 2017-03-29 DIAGNOSIS — R51 Headache: Secondary | ICD-10-CM | POA: Diagnosis not present

## 2017-03-29 DIAGNOSIS — M25562 Pain in left knee: Secondary | ICD-10-CM | POA: Diagnosis not present

## 2017-03-29 DIAGNOSIS — M542 Cervicalgia: Secondary | ICD-10-CM | POA: Diagnosis not present

## 2017-03-29 DIAGNOSIS — M48061 Spinal stenosis, lumbar region without neurogenic claudication: Secondary | ICD-10-CM | POA: Diagnosis not present

## 2017-03-29 DIAGNOSIS — M25511 Pain in right shoulder: Secondary | ICD-10-CM | POA: Diagnosis not present

## 2017-03-29 DIAGNOSIS — M5416 Radiculopathy, lumbar region: Secondary | ICD-10-CM | POA: Diagnosis not present

## 2017-03-30 ENCOUNTER — Other Ambulatory Visit: Payer: Self-pay | Admitting: Family Medicine

## 2017-05-27 ENCOUNTER — Other Ambulatory Visit: Payer: Self-pay | Admitting: Family Medicine

## 2017-05-29 ENCOUNTER — Telehealth: Payer: Self-pay | Admitting: Family Medicine

## 2017-05-29 MED ORDER — RANITIDINE HCL 150 MG PO TABS: 300.0000 mg | ORAL_TABLET | Freq: Two times a day (BID) | ORAL | 0 refills | Status: DC

## 2017-05-29 MED ORDER — OMEPRAZOLE 20 MG PO CPDR: DELAYED_RELEASE_CAPSULE | ORAL | 1 refills | Status: DC

## 2017-05-29 NOTE — Telephone Encounter (Signed)
Pt is needing refills on omeprazole (PRILOSEC) 20 MG capsule ranitidine (ZANTAC) 150 MG tablet    [facility] [address]

## 2017-05-29 NOTE — Telephone Encounter (Signed)
[facility] [address]

## 2017-06-29 ENCOUNTER — Other Ambulatory Visit: Payer: Self-pay | Admitting: Family Medicine

## 2017-06-29 NOTE — Telephone Encounter (Signed)
Ok times one, make sure six mo visit sched should be around end of mo or next, if not, plz sched

## 2017-07-05 DIAGNOSIS — M79604 Pain in right leg: Secondary | ICD-10-CM | POA: Diagnosis not present

## 2017-07-05 DIAGNOSIS — Z888 Allergy status to other drugs, medicaments and biological substances status: Secondary | ICD-10-CM | POA: Diagnosis not present

## 2017-07-05 DIAGNOSIS — M479 Spondylosis, unspecified: Secondary | ICD-10-CM | POA: Diagnosis not present

## 2017-07-05 DIAGNOSIS — Z791 Long term (current) use of non-steroidal anti-inflammatories (NSAID): Secondary | ICD-10-CM | POA: Diagnosis not present

## 2017-07-05 DIAGNOSIS — M79601 Pain in right arm: Secondary | ICD-10-CM | POA: Diagnosis not present

## 2017-07-05 DIAGNOSIS — M545 Low back pain: Secondary | ICD-10-CM | POA: Diagnosis not present

## 2017-07-05 DIAGNOSIS — M542 Cervicalgia: Secondary | ICD-10-CM | POA: Diagnosis not present

## 2017-07-05 DIAGNOSIS — M25561 Pain in right knee: Secondary | ICD-10-CM | POA: Diagnosis not present

## 2017-07-05 DIAGNOSIS — G894 Chronic pain syndrome: Secondary | ICD-10-CM | POA: Diagnosis not present

## 2017-07-05 DIAGNOSIS — M25562 Pain in left knee: Secondary | ICD-10-CM | POA: Diagnosis not present

## 2017-07-05 DIAGNOSIS — Z79891 Long term (current) use of opiate analgesic: Secondary | ICD-10-CM | POA: Diagnosis not present

## 2017-07-05 DIAGNOSIS — M79602 Pain in left arm: Secondary | ICD-10-CM | POA: Diagnosis not present

## 2017-07-05 DIAGNOSIS — M25512 Pain in left shoulder: Secondary | ICD-10-CM | POA: Diagnosis not present

## 2017-07-05 DIAGNOSIS — M48061 Spinal stenosis, lumbar region without neurogenic claudication: Secondary | ICD-10-CM | POA: Diagnosis not present

## 2017-07-05 DIAGNOSIS — Z881 Allergy status to other antibiotic agents status: Secondary | ICD-10-CM | POA: Diagnosis not present

## 2017-07-05 DIAGNOSIS — M25571 Pain in right ankle and joints of right foot: Secondary | ICD-10-CM | POA: Diagnosis not present

## 2017-07-05 DIAGNOSIS — M25541 Pain in joints of right hand: Secondary | ICD-10-CM | POA: Diagnosis not present

## 2017-07-05 DIAGNOSIS — G8929 Other chronic pain: Secondary | ICD-10-CM | POA: Diagnosis not present

## 2017-07-05 DIAGNOSIS — M5410 Radiculopathy, site unspecified: Secondary | ICD-10-CM | POA: Diagnosis not present

## 2017-07-05 DIAGNOSIS — M25572 Pain in left ankle and joints of left foot: Secondary | ICD-10-CM | POA: Diagnosis not present

## 2017-07-05 DIAGNOSIS — R51 Headache: Secondary | ICD-10-CM | POA: Diagnosis not present

## 2017-07-05 DIAGNOSIS — M25511 Pain in right shoulder: Secondary | ICD-10-CM | POA: Diagnosis not present

## 2017-07-05 DIAGNOSIS — M4800 Spinal stenosis, site unspecified: Secondary | ICD-10-CM | POA: Diagnosis not present

## 2017-07-05 DIAGNOSIS — M25542 Pain in joints of left hand: Secondary | ICD-10-CM | POA: Diagnosis not present

## 2017-07-05 DIAGNOSIS — M79605 Pain in left leg: Secondary | ICD-10-CM | POA: Diagnosis not present

## 2017-07-21 ENCOUNTER — Ambulatory Visit: Payer: Medicare Other | Admitting: Family Medicine

## 2017-08-08 ENCOUNTER — Ambulatory Visit: Payer: Medicare Other | Admitting: Family Medicine

## 2017-08-09 ENCOUNTER — Encounter: Payer: Self-pay | Admitting: Family Medicine

## 2017-08-09 ENCOUNTER — Ambulatory Visit: Payer: Medicare Other | Admitting: Family Medicine

## 2017-08-09 VITALS — BP 128/74 | Ht 65.25 in | Wt 164.2 lb

## 2017-08-09 DIAGNOSIS — Z125 Encounter for screening for malignant neoplasm of prostate: Secondary | ICD-10-CM

## 2017-08-09 DIAGNOSIS — F5101 Primary insomnia: Secondary | ICD-10-CM

## 2017-08-09 DIAGNOSIS — Z79899 Other long term (current) drug therapy: Secondary | ICD-10-CM

## 2017-08-09 DIAGNOSIS — Z13220 Encounter for screening for lipoid disorders: Secondary | ICD-10-CM | POA: Diagnosis not present

## 2017-08-09 DIAGNOSIS — Z0000 Encounter for general adult medical examination without abnormal findings: Secondary | ICD-10-CM

## 2017-08-09 DIAGNOSIS — F321 Major depressive disorder, single episode, moderate: Secondary | ICD-10-CM | POA: Diagnosis not present

## 2017-08-09 DIAGNOSIS — K219 Gastro-esophageal reflux disease without esophagitis: Secondary | ICD-10-CM | POA: Diagnosis not present

## 2017-08-09 DIAGNOSIS — I10 Essential (primary) hypertension: Secondary | ICD-10-CM

## 2017-08-09 MED ORDER — FLUOXETINE HCL 20 MG PO CAPS: 20.0000 mg | ORAL_CAPSULE | Freq: Every day | ORAL | 5 refills | Status: DC

## 2017-08-09 MED ORDER — OMEPRAZOLE 20 MG PO CPDR: DELAYED_RELEASE_CAPSULE | ORAL | 1 refills | Status: DC

## 2017-08-09 MED ORDER — RANITIDINE HCL 150 MG PO TABS: 300.0000 mg | ORAL_TABLET | Freq: Two times a day (BID) | ORAL | 0 refills | Status: DC

## 2017-08-09 MED ORDER — BENAZEPRIL HCL 20 MG PO TABS: 20.0000 mg | ORAL_TABLET | Freq: Every day | ORAL | 0 refills | Status: DC

## 2017-08-09 MED ORDER — AMLODIPINE BESYLATE 10 MG PO TABS: 10.0000 mg | ORAL_TABLET | Freq: Every day | ORAL | 1 refills | Status: DC

## 2017-08-09 NOTE — Progress Notes (Signed)
   Subjective:    Patient ID: Gary Harris, male    DOB: [birth year], 67 y.o.   MRN: [id number]  Hypertension  This is a chronic problem. There are no compliance problems.   Depression         This is a chronic problem.  Compliance with treatment is good. Pt states he is sleeping well at night. Insomnia and depression are occasional problems.  Blood pressure medicine and blood pressure levels reviewed today with patient. Compliant with blood pressure medicine. States does not miss a dose. No obvious side effects. Blood pressure generally good when checked elsewhere. Watching salt intake.   Patient notes ongoing compliance with antidepressant medication. No obvious side effects. Reports does not miss a dose. Overall continues to help depression substantially. No thoughts of homicide or suicide. Would like to maintain medication.  bp s always good   Exercise not as good as possible, gets out and walks ome   q three mo on the pain  meds  Both hands hurt left geater than right, some acheiness in the hands   Been off prozac for awhile, takes it on occasion, for a little while and it helps  Patient compliant with insomnia medication. Generally takes most nights. No obvious morning drowsiness. Definitely helps patient sleep. Without it patient states would not get a good nights rest. Takes the [employer] on occasio    No sig drowsiness with the oxycodone    Number overall  Very good     Review of Systems  Psychiatric/Behavioral: Positive for depression.       Objective:   Physical Exam  Alert and oriented, vitals reviewed and stable, NAD ENT-TM's and ext canals WNL bilat via otoscopic exam Soft palate, tonsils and post pharynx WNL via oropharyngeal exam Neck-symmetric, no masses; thyroid nonpalpable and nontender Pulmonary-no tachypnea or accessory muscle use; Clear without wheezes via auscultation Card--no abnrml murmurs, rhythm reg and rate WNL Carotid pulses  symmetric, without bruits  Hands.  Pulses good.  Strength intact/sensation intact with no Heberden's nodes.  Impression chronic pain.  Followed by specialist for this.  Briefly discussed.     Assessment & Plan:  1  2.  Hypertension.  Discussed.  Good control discussed.  Plans discussed maintain same medications  3.  Depression ongoing.  Patient uses Prozac episodically.  Advised this is not standard way of taking, but patient states it is best for him.  No suicidal thoughts.  We will honor patient's wishes  4.  Hand pain nonspecific.  No evidence of progressive arthritis or vascular disease at this time.  Discussed.  Symptom care discussed  Follow-up in 6 months for wellness plus chronic.  Diet exercise discussed and encouraged/medications refilled

## 2017-10-02 DIAGNOSIS — Z79891 Long term (current) use of opiate analgesic: Secondary | ICD-10-CM | POA: Diagnosis not present

## 2017-10-02 DIAGNOSIS — M19011 Primary osteoarthritis, right shoulder: Secondary | ICD-10-CM | POA: Diagnosis not present

## 2017-10-02 DIAGNOSIS — M1710 Unilateral primary osteoarthritis, unspecified knee: Secondary | ICD-10-CM | POA: Diagnosis not present

## 2017-10-02 DIAGNOSIS — M542 Cervicalgia: Secondary | ICD-10-CM | POA: Diagnosis not present

## 2017-10-02 DIAGNOSIS — M19012 Primary osteoarthritis, left shoulder: Secondary | ICD-10-CM | POA: Diagnosis not present

## 2017-10-02 DIAGNOSIS — M19072 Primary osteoarthritis, left ankle and foot: Secondary | ICD-10-CM | POA: Diagnosis not present

## 2017-10-02 DIAGNOSIS — M19071 Primary osteoarthritis, right ankle and foot: Secondary | ICD-10-CM | POA: Diagnosis not present

## 2017-10-02 DIAGNOSIS — G894 Chronic pain syndrome: Secondary | ICD-10-CM | POA: Diagnosis not present

## 2017-10-02 DIAGNOSIS — M48061 Spinal stenosis, lumbar region without neurogenic claudication: Secondary | ICD-10-CM | POA: Diagnosis not present

## 2017-11-10 DIAGNOSIS — Z125 Encounter for screening for malignant neoplasm of prostate: Secondary | ICD-10-CM | POA: Diagnosis not present

## 2017-11-10 DIAGNOSIS — Z13220 Encounter for screening for lipoid disorders: Secondary | ICD-10-CM | POA: Diagnosis not present

## 2017-11-10 DIAGNOSIS — Z0000 Encounter for general adult medical examination without abnormal findings: Secondary | ICD-10-CM | POA: Diagnosis not present

## 2017-11-10 DIAGNOSIS — I10 Essential (primary) hypertension: Secondary | ICD-10-CM | POA: Diagnosis not present

## 2017-11-10 DIAGNOSIS — Z79899 Other long term (current) drug therapy: Secondary | ICD-10-CM | POA: Diagnosis not present

## 2017-11-11 LAB — BASIC METABOLIC PANEL
BUN / CREAT RATIO: 16 (ref 10–24)
BUN: 20 mg/dL (ref 8–27)
CALCIUM: 9.3 mg/dL (ref 8.6–10.2)
CHLORIDE: 102 mmol/L (ref 96–106)
CO2: 24 mmol/L (ref 20–29)
Creatinine, Ser: 1.28 mg/dL — ABNORMAL HIGH (ref 0.76–1.27)
GFR calc non Af Amer: 58 mL/min/{1.73_m2} — ABNORMAL LOW (ref >59)
GFR, EST AFRICAN AMERICAN: 67 mL/min/{1.73_m2} (ref >59)
Glucose: 86 mg/dL (ref 65–99)
POTASSIUM: 4.3 mmol/L (ref 3.5–5.2)
SODIUM: 141 mmol/L (ref 134–144)

## 2017-11-11 LAB — HEPATIC FUNCTION PANEL
ALBUMIN: 4.7 g/dL (ref 3.6–4.8)
ALT: 6 IU/L (ref 0–44)
AST: 12 IU/L (ref 0–40)
Alkaline Phosphatase: 46 IU/L (ref 39–117)
BILIRUBIN TOTAL: 0.4 mg/dL (ref 0.0–1.2)
Bilirubin, Direct: 0.13 mg/dL (ref 0.00–0.40)
Total Protein: 7.1 g/dL (ref 6.0–8.5)

## 2017-11-11 LAB — PSA: PROSTATE SPECIFIC AG, SERUM: 2.2 ng/mL (ref 0.0–4.0)

## 2017-11-11 LAB — LIPID PANEL
Chol/HDL Ratio: 5 ratio (ref 0.0–5.0)
Cholesterol, Total: 186 mg/dL (ref 100–199)
HDL: 37 mg/dL — ABNORMAL LOW (ref >39)
LDL CALC: 115 mg/dL — AB (ref 0–99)
Triglycerides: 170 mg/dL — ABNORMAL HIGH (ref 0–149)
VLDL CHOLESTEROL CAL: 34 mg/dL (ref 5–40)

## 2017-11-14 ENCOUNTER — Encounter: Payer: Self-pay | Admitting: Family Medicine

## 2017-12-25 DIAGNOSIS — Z79891 Long term (current) use of opiate analgesic: Secondary | ICD-10-CM | POA: Diagnosis not present

## 2017-12-25 DIAGNOSIS — G894 Chronic pain syndrome: Secondary | ICD-10-CM | POA: Diagnosis not present

## 2017-12-25 DIAGNOSIS — M48061 Spinal stenosis, lumbar region without neurogenic claudication: Secondary | ICD-10-CM | POA: Diagnosis not present

## 2017-12-25 DIAGNOSIS — M542 Cervicalgia: Secondary | ICD-10-CM | POA: Diagnosis not present

## 2018-01-01 ENCOUNTER — Other Ambulatory Visit: Payer: Self-pay | Admitting: Family Medicine

## 2018-01-31 ENCOUNTER — Other Ambulatory Visit: Payer: Self-pay | Admitting: Family Medicine

## 2018-01-31 NOTE — Telephone Encounter (Signed)
Change to pepcid 20 bid one yrs worth

## 2018-02-01 ENCOUNTER — Other Ambulatory Visit: Payer: Self-pay | Admitting: Family Medicine

## 2018-02-01 MED ORDER — FAMOTIDINE 20 MG PO TABS: 20.0000 mg | ORAL_TABLET | Freq: Two times a day (BID) | ORAL | 11 refills | Status: DC

## 2018-02-01 NOTE — Telephone Encounter (Signed)
Pt returned call and verbalized understanding  

## 2018-02-01 NOTE — Telephone Encounter (Signed)
Left message to return call 

## 2018-03-24 ENCOUNTER — Other Ambulatory Visit: Payer: Self-pay | Admitting: Family Medicine

## 2018-03-26 ENCOUNTER — Other Ambulatory Visit: Payer: Self-pay | Admitting: Family Medicine

## 2018-03-28 DIAGNOSIS — Z0289 Encounter for other administrative examinations: Secondary | ICD-10-CM | POA: Insufficient documentation

## 2018-03-28 DIAGNOSIS — M542 Cervicalgia: Secondary | ICD-10-CM | POA: Diagnosis not present

## 2018-03-28 DIAGNOSIS — M48061 Spinal stenosis, lumbar region without neurogenic claudication: Secondary | ICD-10-CM | POA: Diagnosis not present

## 2018-03-28 DIAGNOSIS — Z79891 Long term (current) use of opiate analgesic: Secondary | ICD-10-CM | POA: Diagnosis not present

## 2018-03-28 DIAGNOSIS — G894 Chronic pain syndrome: Secondary | ICD-10-CM | POA: Diagnosis not present

## 2018-04-25 ENCOUNTER — Other Ambulatory Visit: Payer: Self-pay | Admitting: Family Medicine

## 2018-04-27 ENCOUNTER — Other Ambulatory Visit: Payer: Self-pay | Admitting: Family Medicine

## 2018-05-31 ENCOUNTER — Other Ambulatory Visit: Payer: Self-pay | Admitting: Family Medicine

## 2018-05-31 NOTE — Telephone Encounter (Signed)
Seen 5 22 19  ok times, one write ov needed on it

## 2018-06-18 ENCOUNTER — Ambulatory Visit: Payer: Medicare Other | Admitting: Family Medicine

## 2018-06-18 DIAGNOSIS — G894 Chronic pain syndrome: Secondary | ICD-10-CM | POA: Diagnosis not present

## 2018-06-18 DIAGNOSIS — M48061 Spinal stenosis, lumbar region without neurogenic claudication: Secondary | ICD-10-CM | POA: Diagnosis not present

## 2018-06-18 DIAGNOSIS — Z79891 Long term (current) use of opiate analgesic: Secondary | ICD-10-CM | POA: Diagnosis not present

## 2018-06-18 DIAGNOSIS — M542 Cervicalgia: Secondary | ICD-10-CM | POA: Diagnosis not present

## 2018-06-22 ENCOUNTER — Ambulatory Visit (INDEPENDENT_AMBULATORY_CARE_PROVIDER_SITE_OTHER): Payer: Medicare Other | Admitting: Family Medicine

## 2018-06-22 ENCOUNTER — Other Ambulatory Visit: Payer: Self-pay

## 2018-06-22 ENCOUNTER — Encounter: Payer: Self-pay | Admitting: Family Medicine

## 2018-06-22 VITALS — BP 108/68 | Ht 65.0 in

## 2018-06-22 DIAGNOSIS — I10 Essential (primary) hypertension: Secondary | ICD-10-CM

## 2018-06-22 MED ORDER — BENAZEPRIL HCL 20 MG PO TABS: 20.0000 mg | ORAL_TABLET | Freq: Every day | ORAL | 1 refills | Status: DC

## 2018-06-22 MED ORDER — AMLODIPINE BESYLATE 10 MG PO TABS: ORAL_TABLET | ORAL | 1 refills | Status: DC

## 2018-06-22 MED ORDER — FLUOXETINE HCL 20 MG PO CAPS: 20.0000 mg | ORAL_CAPSULE | Freq: Every day | ORAL | 1 refills | Status: DC

## 2018-06-22 MED ORDER — FAMOTIDINE 20 MG PO TABS: 20.0000 mg | ORAL_TABLET | Freq: Two times a day (BID) | ORAL | 1 refills | Status: DC

## 2018-06-22 MED ORDER — OMEPRAZOLE 20 MG PO CPDR: DELAYED_RELEASE_CAPSULE | ORAL | 1 refills | Status: DC

## 2018-06-22 NOTE — Progress Notes (Signed)
   Subjective:  Audio only  Patient ID: Gary Harris, male    DOB: 11-12-1950, 68 y.o.   MRN: [id number]  Hypertension  This is a chronic problem. The current episode started more than 1 year ago. There are no compliance problems (takes meds every day, doing his best to exercise and eat healthy).   takes amlodipine 10mg  and benazepril 20 mg.  Pt checked bp about one week ago and it was 108/68. States it is never high when he checks it.  bp overall good  Walking reg   Blood pressure medicine and blood pressure levels reviewed today with patient. Compliant with blood pressure medicine. States does not miss a dose. No obvious side effects. Blood pressure generally good when checked elsewhere. Watching salt intake.   Patient notes ongoing compliance with antidepressant medication. No obvious side effects. Reports does not miss a dose. Overall continues to help depression substantially. No thoughts of homicide or suicide. Would like to maintain medication.   Overall mood  Is pretty good  Diet overall b   Would like a 90 day supply of meds.   Virtual Visit via Telephone Note  I connected with Gary Harris on 06/22/18 at 10:30 AM EDT by telephone and verified that I am speaking with the correct person using two identifiers.   I discussed the limitations, risks, security and privacy concerns of performing an evaluation and management service by telephone and the availability of in person appointments. I also discussed with the patient that there may be a patient responsible charge related to this service. The patient expressed understanding and agreed to proceed.   History of Present Illness:    Observations/Objective:   Assessment and Plan:   Follow Up Instructions:    I discussed the assessment and treatment plan with the patient. The patient was provided an opportunity to ask questions and all were answered. The patient agreed with the plan and demonstrated an  understanding of the instructions.   The patient was advised to call back or seek an in-person evaluation if the symptoms worsen or if the condition fails to improve as anticipated.  I provided 20 minutes of non-face-to-face time during this encounter.   Kyra Manges, LPN   Review of Systems No headache, no major weight loss or weight gain, no chest pain no back pain abdominal pain no change in bowel habits complete ROS otherwise negative     Objective:   Physical Exam  virt      Assessment & Plan:  Htn apparent good control, compliance discussed, meds refilled, diet exercise disc  Depr clinically stable, to maintain med  Chronic pain, followed by specialist

## 2018-08-29 DIAGNOSIS — M542 Cervicalgia: Secondary | ICD-10-CM | POA: Diagnosis not present

## 2018-08-29 DIAGNOSIS — G894 Chronic pain syndrome: Secondary | ICD-10-CM | POA: Diagnosis not present

## 2018-08-29 DIAGNOSIS — Z79891 Long term (current) use of opiate analgesic: Secondary | ICD-10-CM | POA: Diagnosis not present

## 2018-08-29 DIAGNOSIS — M48061 Spinal stenosis, lumbar region without neurogenic claudication: Secondary | ICD-10-CM | POA: Diagnosis not present

## 2018-10-24 DIAGNOSIS — Z79891 Long term (current) use of opiate analgesic: Secondary | ICD-10-CM | POA: Diagnosis not present

## 2018-10-24 DIAGNOSIS — G894 Chronic pain syndrome: Secondary | ICD-10-CM | POA: Diagnosis not present

## 2018-10-24 DIAGNOSIS — M48061 Spinal stenosis, lumbar region without neurogenic claudication: Secondary | ICD-10-CM | POA: Diagnosis not present

## 2018-10-24 DIAGNOSIS — M542 Cervicalgia: Secondary | ICD-10-CM | POA: Diagnosis not present

## 2019-01-18 ENCOUNTER — Other Ambulatory Visit: Payer: Self-pay | Admitting: Family Medicine

## 2019-01-24 DIAGNOSIS — G894 Chronic pain syndrome: Secondary | ICD-10-CM | POA: Diagnosis not present

## 2019-01-24 DIAGNOSIS — Z79891 Long term (current) use of opiate analgesic: Secondary | ICD-10-CM | POA: Diagnosis not present

## 2019-01-24 DIAGNOSIS — M48061 Spinal stenosis, lumbar region without neurogenic claudication: Secondary | ICD-10-CM | POA: Diagnosis not present

## 2019-01-24 DIAGNOSIS — Z0289 Encounter for other administrative examinations: Secondary | ICD-10-CM | POA: Diagnosis not present

## 2019-01-24 DIAGNOSIS — M542 Cervicalgia: Secondary | ICD-10-CM | POA: Diagnosis not present

## 2019-01-27 ENCOUNTER — Other Ambulatory Visit: Payer: Self-pay | Admitting: Family Medicine

## 2019-01-28 NOTE — Telephone Encounter (Signed)
Please contact pt to set up appt; then may route back to nurses. Thank you 

## 2019-01-28 NOTE — Telephone Encounter (Signed)
rec sched six mo , then ref times one

## 2019-01-29 ENCOUNTER — Other Ambulatory Visit: Payer: Self-pay | Admitting: Family Medicine

## 2019-01-29 NOTE — Telephone Encounter (Signed)
Scheduled 11/17

## 2019-02-05 ENCOUNTER — Other Ambulatory Visit: Payer: Self-pay

## 2019-02-05 ENCOUNTER — Ambulatory Visit: Payer: Medicare Other | Admitting: Family Medicine

## 2019-02-05 NOTE — Progress Notes (Unsigned)
   Subjective:    Patient ID: Gary Harris, male    DOB: 02-02-1951, 68 y.o.   MRN: [id number]  HPI    Review of Systems     Objective:   Physical Exam        Assessment & Plan:

## 2019-03-04 ENCOUNTER — Ambulatory Visit (INDEPENDENT_AMBULATORY_CARE_PROVIDER_SITE_OTHER): Payer: Medicare Other | Admitting: Family Medicine

## 2019-03-04 ENCOUNTER — Other Ambulatory Visit: Payer: Self-pay

## 2019-03-04 DIAGNOSIS — I10 Essential (primary) hypertension: Secondary | ICD-10-CM

## 2019-03-04 DIAGNOSIS — F321 Major depressive disorder, single episode, moderate: Secondary | ICD-10-CM

## 2019-03-04 DIAGNOSIS — F5101 Primary insomnia: Secondary | ICD-10-CM | POA: Diagnosis not present

## 2019-03-04 MED ORDER — BENAZEPRIL HCL 20 MG PO TABS: ORAL_TABLET | ORAL | 1 refills | Status: DC

## 2019-03-04 MED ORDER — AMLODIPINE BESYLATE 10 MG PO TABS: ORAL_TABLET | ORAL | 1 refills | Status: DC

## 2019-03-04 MED ORDER — ZOLPIDEM TARTRATE 10 MG PO TABS: 10.0000 mg | ORAL_TABLET | Freq: Every evening | ORAL | 5 refills | Status: DC | PRN

## 2019-03-04 MED ORDER — FAMOTIDINE 20 MG PO TABS: 20.0000 mg | ORAL_TABLET | Freq: Two times a day (BID) | ORAL | 1 refills | Status: DC

## 2019-03-04 MED ORDER — TAMSULOSIN HCL 0.4 MG PO CAPS: ORAL_CAPSULE | ORAL | 5 refills | Status: DC

## 2019-03-04 MED ORDER — FLUOXETINE HCL 20 MG PO CAPS: 20.0000 mg | ORAL_CAPSULE | Freq: Every day | ORAL | 1 refills | Status: DC

## 2019-03-04 MED ORDER — OMEPRAZOLE 20 MG PO CPDR: DELAYED_RELEASE_CAPSULE | ORAL | 1 refills | Status: DC

## 2019-03-04 NOTE — Progress Notes (Signed)
   Subjective:  Audio only  Patient ID: Gary Harris, male    DOB: Mar 25, 1950, 68 y.o.   MRN: [id number]  Hypertension This is a chronic problem. Treatments tried: amlodipine, benazepril. There are no compliance problems (takes meds everyday, walks for exercise, eats healthy).    Virtual Visit via Telephone Note  I connected with Salli Real on 03/04/19 at 10:00 AM EST by telephone and verified that I am speaking with the correct person using two identifiers.  Location: Patient: home Provider: office   I discussed the limitations, risks, security and privacy concerns of performing an evaluation and management service by telephone and the availability of in person appointments. I also discussed with the patient that there may be a patient responsible charge related to this service. The patient expressed understanding and agreed to proceed.   History of Present Illness:    Observations/Objective:   Assessment and Plan:   Follow Up Instructions:    I discussed the assessment and treatment plan with the patient. The patient was provided an opportunity to ask questions and all were answered. The patient agreed with the plan and demonstrated an understanding of the instructions.   The patient was advised to call back or seek an in-person evaluation if the symptoms worsen or if the condition fails to improve as anticipated.  I provided 20 minutes of non-face-to-face time during this encounter.   Blood pressure medicine and blood pressure levels reviewed today with patient. Compliant with blood pressure medicine. States does not miss a dose. No obvious side effects. Blood pressure generally good when checked elsewhere. Watching salt intake.  bp numbers excellent      Review of Systems Ongoing chronic pain sees chronic specialist.  No chest pain no shortness of breath fever    Objective:   Physical Exam    Virtual    Assessment & Plan:  Impression 1  hypertension.  Good control discussed maintain same meds  2.  Chronic depression clinically stable discussed  3.  Chronic pain followed by specialists  Patient has had flu shot.  Medications to be refilled diet exercise discussed follow-up in 6 months

## 2019-04-04 ENCOUNTER — Other Ambulatory Visit: Payer: Self-pay | Admitting: Family Medicine

## 2019-04-25 ENCOUNTER — Encounter: Payer: Self-pay | Admitting: Family Medicine

## 2019-04-25 DIAGNOSIS — G894 Chronic pain syndrome: Secondary | ICD-10-CM | POA: Diagnosis not present

## 2019-04-25 DIAGNOSIS — M542 Cervicalgia: Secondary | ICD-10-CM | POA: Diagnosis not present

## 2019-04-25 DIAGNOSIS — Z79891 Long term (current) use of opiate analgesic: Secondary | ICD-10-CM | POA: Diagnosis not present

## 2019-04-25 DIAGNOSIS — M48061 Spinal stenosis, lumbar region without neurogenic claudication: Secondary | ICD-10-CM | POA: Diagnosis not present

## 2019-07-18 DIAGNOSIS — Z79891 Long term (current) use of opiate analgesic: Secondary | ICD-10-CM | POA: Diagnosis not present

## 2019-07-18 DIAGNOSIS — M542 Cervicalgia: Secondary | ICD-10-CM | POA: Diagnosis not present

## 2019-07-18 DIAGNOSIS — G894 Chronic pain syndrome: Secondary | ICD-10-CM | POA: Diagnosis not present

## 2019-07-18 DIAGNOSIS — M48061 Spinal stenosis, lumbar region without neurogenic claudication: Secondary | ICD-10-CM | POA: Diagnosis not present

## 2019-08-27 ENCOUNTER — Other Ambulatory Visit: Payer: Self-pay | Admitting: Family Medicine

## 2019-08-27 DIAGNOSIS — Z13220 Encounter for screening for lipoid disorders: Secondary | ICD-10-CM

## 2019-08-27 DIAGNOSIS — I10 Essential (primary) hypertension: Secondary | ICD-10-CM

## 2019-08-27 DIAGNOSIS — Z79899 Other long term (current) drug therapy: Secondary | ICD-10-CM

## 2019-08-27 DIAGNOSIS — Z125 Encounter for screening for malignant neoplasm of prostate: Secondary | ICD-10-CM

## 2019-08-27 NOTE — Telephone Encounter (Signed)
Please schedule appt and then route back to nurses  Last labs were 11/10/17 lipid, liver, bmp, psa

## 2019-08-28 NOTE — Telephone Encounter (Signed)
Last check up 03/04/19 and has appt on 6/23. Last labs were 11/10/17 lipid, liver, bmp, psa

## 2019-08-28 NOTE — Telephone Encounter (Signed)
Scheduled 6/23

## 2019-08-28 NOTE — Telephone Encounter (Signed)
pls order, cbc, cmp, lipid panel, and pas.  Thx, dr. Karie Schwalbe

## 2019-08-29 ENCOUNTER — Other Ambulatory Visit: Payer: Self-pay | Admitting: *Deleted

## 2019-08-29 DIAGNOSIS — Z125 Encounter for screening for malignant neoplasm of prostate: Secondary | ICD-10-CM

## 2019-08-29 DIAGNOSIS — I10 Essential (primary) hypertension: Secondary | ICD-10-CM

## 2019-08-29 DIAGNOSIS — Z79899 Other long term (current) drug therapy: Secondary | ICD-10-CM

## 2019-08-29 DIAGNOSIS — Z13220 Encounter for screening for lipoid disorders: Secondary | ICD-10-CM

## 2019-08-30 NOTE — Telephone Encounter (Signed)
Pt.notified

## 2019-08-30 NOTE — Addendum Note (Signed)
Addended by: Metro Kung on: 08/30/2019 03:39 PM   Modules accepted: Orders

## 2019-08-30 NOTE — Telephone Encounter (Signed)
Orders put in and left a message to return call to notify pt.  

## 2019-09-04 ENCOUNTER — Telehealth: Payer: Self-pay | Admitting: Family Medicine

## 2019-09-04 DIAGNOSIS — R5383 Other fatigue: Secondary | ICD-10-CM

## 2019-09-04 NOTE — Telephone Encounter (Signed)
Pt contacted and verbalized understanding. Labs at front window to pick up

## 2019-09-04 NOTE — Telephone Encounter (Signed)
Pt will be coming in on the 27th he would like to add testosterone added to his lab work to be done, he wants to go over this with Dr Ladona Ridgel when he comes in.

## 2019-09-04 NOTE — Telephone Encounter (Signed)
Pt contacted but wife picked up. Pt near wife. Pt has been having increased fatigued and has been really tired lately. Pt sees Dr.James in [place] for pain and was instructed to call PCP to have thyroid and testosterone levels checked. Please advise. Thank you

## 2019-09-04 NOTE — Telephone Encounter (Signed)
Please advise. Thank you

## 2019-09-04 NOTE — Telephone Encounter (Signed)
Need a diagnosis code to link to this lab.  What is the concern for why he's requesting testosterone.  Thx. Dr. Ladona Ridgel

## 2019-09-11 ENCOUNTER — Ambulatory Visit: Payer: Medicare Other | Admitting: Family Medicine

## 2019-09-18 ENCOUNTER — Other Ambulatory Visit: Payer: Self-pay | Admitting: Family Medicine

## 2019-09-18 NOTE — Telephone Encounter (Signed)
Has upcoming appt 10/02/19. Last med check up 03/04/19 and orders for bw already put in earlier this month

## 2019-09-29 ENCOUNTER — Other Ambulatory Visit: Payer: Self-pay | Admitting: Family Medicine

## 2019-10-01 NOTE — Telephone Encounter (Signed)
Last check up 03/04/19 and last labs 09/04/19 testosterone, tsh, cbc, cmp, psa, lipid

## 2019-10-02 ENCOUNTER — Ambulatory Visit: Payer: Medicare Other | Admitting: Family Medicine

## 2019-10-17 ENCOUNTER — Other Ambulatory Visit: Payer: Self-pay | Admitting: Family Medicine

## 2019-10-17 NOTE — Telephone Encounter (Signed)
Pt needs f/u appt and labs in next 2-3 months. Order- cbc, cmp, lipids. Thx. Dr. Ladona Ridgel

## 2019-10-17 NOTE — Telephone Encounter (Signed)
Pt contact patient to have pt set up appt. Thank you!!

## 2019-10-18 ENCOUNTER — Ambulatory Visit: Payer: Medicare Other | Admitting: Family Medicine

## 2019-10-30 ENCOUNTER — Other Ambulatory Visit: Payer: Self-pay | Admitting: *Deleted

## 2019-10-30 NOTE — Telephone Encounter (Signed)
Left message to schedule appt

## 2019-10-31 MED ORDER — FAMOTIDINE 20 MG PO TABS: ORAL_TABLET | ORAL | 0 refills | Status: DC

## 2019-10-31 NOTE — Telephone Encounter (Signed)
Scheduled 9/1.

## 2019-11-03 ENCOUNTER — Other Ambulatory Visit: Payer: Self-pay | Admitting: Family Medicine

## 2019-11-20 ENCOUNTER — Ambulatory Visit: Payer: Medicare Other | Admitting: Family Medicine

## 2019-12-15 ENCOUNTER — Other Ambulatory Visit: Payer: Self-pay | Admitting: Family Medicine

## 2019-12-27 ENCOUNTER — Other Ambulatory Visit: Payer: Self-pay | Admitting: Family Medicine

## 2020-02-17 ENCOUNTER — Encounter (HOSPITAL_COMMUNITY): Payer: Self-pay

## 2020-02-17 ENCOUNTER — Emergency Department (HOSPITAL_COMMUNITY)
Admission: EM | Admit: 2020-02-17 | Discharge: 2020-02-17 | Disposition: A | Discharge status: Home / Self Care | Payer: Medicare Other | Attending: Emergency Medicine | Admitting: Emergency Medicine

## 2020-02-17 ENCOUNTER — Other Ambulatory Visit: Payer: Self-pay

## 2020-02-17 DIAGNOSIS — R109 Unspecified abdominal pain: Secondary | ICD-10-CM | POA: Insufficient documentation

## 2020-02-17 DIAGNOSIS — Z5321 Procedure and treatment not carried out due to patient leaving prior to being seen by health care provider: Secondary | ICD-10-CM | POA: Diagnosis not present

## 2020-02-17 NOTE — ED Triage Notes (Signed)
Pt to er, pt states that he had some chips tonight, states that shortly after he started having some intense abd pain and cramping.  States that it was a gradual onset. States that he has had similar episodes in the past that slowly work its way out his bottom.  States that he is tired and hurts. States that he has taken some meds at home with minimal relief.

## 2020-03-12 ENCOUNTER — Other Ambulatory Visit: Payer: Self-pay | Admitting: Family Medicine

## 2020-03-16 ENCOUNTER — Other Ambulatory Visit: Payer: Self-pay

## 2020-03-16 ENCOUNTER — Telehealth: Payer: Self-pay | Admitting: Family Medicine

## 2020-03-16 MED ORDER — AMLODIPINE BESYLATE 10 MG PO TABS
ORAL_TABLET | ORAL | 0 refills | Status: DC
Start: 2020-03-16 — End: 2020-07-09

## 2020-03-16 NOTE — Telephone Encounter (Signed)
Pharmacy requesting refill on Amlodipine 10 mg tablets. Take one tablet po daily. Pt has upcoming appt 04/14/20. Pt last seen 03/04/2019. Please advise. Thank you.

## 2020-03-16 NOTE — Telephone Encounter (Signed)
30 day supply thx. D.r Fay Swider

## 2020-03-16 NOTE — Telephone Encounter (Signed)
Refills sent to pharmacy. 

## 2020-03-18 ENCOUNTER — Other Ambulatory Visit: Payer: Self-pay | Admitting: Family Medicine

## 2020-04-14 ENCOUNTER — Ambulatory Visit: Payer: Medicare Other | Admitting: Family Medicine

## 2020-05-06 ENCOUNTER — Telehealth: Payer: Self-pay | Admitting: Family Medicine

## 2020-05-06 DIAGNOSIS — Z79899 Other long term (current) drug therapy: Secondary | ICD-10-CM

## 2020-05-06 DIAGNOSIS — I10 Essential (primary) hypertension: Secondary | ICD-10-CM

## 2020-05-06 DIAGNOSIS — Z125 Encounter for screening for malignant neoplasm of prostate: Secondary | ICD-10-CM

## 2020-05-06 NOTE — Telephone Encounter (Signed)
Pt cancelled appt for tomorrow.  

## 2020-05-06 NOTE — Telephone Encounter (Signed)
Attempted to get in touch with patient. Pt does not have voicemail set up at this time. Lab orders placed

## 2020-05-07 ENCOUNTER — Ambulatory Visit: Payer: Medicare Other | Admitting: Family Medicine

## 2020-06-15 ENCOUNTER — Other Ambulatory Visit: Payer: Self-pay | Admitting: Family Medicine

## 2020-06-16 NOTE — Telephone Encounter (Signed)
Needs visit last one was 03/04/19

## 2020-06-17 NOTE — Telephone Encounter (Signed)
Cant get in touch with patient 

## 2020-06-24 NOTE — Telephone Encounter (Signed)
Tried calling again to schedule appointment cant get in touch with patient

## 2020-06-25 NOTE — Telephone Encounter (Signed)
Could you all send a letter to patient to call our office? I also tried to call but patient not available. Thank you!

## 2020-06-25 NOTE — Telephone Encounter (Signed)
Cant not get in touch with patient tried twice no answer.

## 2020-07-09 ENCOUNTER — Telehealth: Payer: Self-pay | Admitting: Family Medicine

## 2020-07-09 ENCOUNTER — Other Ambulatory Visit: Payer: Self-pay | Admitting: Family Medicine

## 2020-07-09 MED ORDER — AMLODIPINE BESYLATE 10 MG PO TABS
ORAL_TABLET | ORAL | 0 refills | Status: DC
Start: 2020-07-09 — End: 2020-08-10

## 2020-07-09 MED ORDER — BENAZEPRIL HCL 20 MG PO TABS
ORAL_TABLET | ORAL | 1 refills | Status: DC
Start: 2020-07-09 — End: 2020-08-10

## 2020-07-09 MED ORDER — TAMSULOSIN HCL 0.4 MG PO CAPS
0.4000 mg | ORAL_CAPSULE | Freq: Every day | ORAL | 1 refills | Status: DC
Start: 2020-07-09 — End: 2020-09-07

## 2020-07-09 NOTE — Telephone Encounter (Signed)
Patient has appointment for 5/23 and needing his amlodipine 10 mg called into [facility]

## 2020-07-09 NOTE — Telephone Encounter (Signed)
Pt contacted. Pt states he also needs refill on Tamsulosin and Benazepril. Please advise. Thank you

## 2020-07-09 NOTE — Telephone Encounter (Signed)
May have 90 days on amlodipine

## 2020-07-09 NOTE — Telephone Encounter (Signed)
Please advise. Thank you

## 2020-07-09 NOTE — Telephone Encounter (Signed)
Refills sent to pharmacy and pt is aware  

## 2020-07-09 NOTE — Telephone Encounter (Signed)
May give 30-day with 1 refill of each

## 2020-07-17 ENCOUNTER — Other Ambulatory Visit: Payer: Self-pay | Admitting: Family Medicine

## 2020-07-17 ENCOUNTER — Telehealth: Payer: Self-pay | Admitting: Family Medicine

## 2020-07-17 NOTE — Telephone Encounter (Signed)
Pharmacy requesting refill on Omeprazole 20 mg capsule. Take 2 capsules po BID. Pt has upcoming appt in may. Please advise. Thank you

## 2020-07-19 NOTE — Telephone Encounter (Signed)
Pt needs appt, high dose of this medication last ordered by Dr. Brett Canales.   Pt hasn't established care in over 1 yr. No further refills till appt.   Dr. Ladona Ridgel

## 2020-07-20 NOTE — Telephone Encounter (Signed)
appt 5/23

## 2020-08-04 ENCOUNTER — Telehealth: Payer: Self-pay

## 2020-08-04 ENCOUNTER — Other Ambulatory Visit: Payer: Self-pay

## 2020-08-04 DIAGNOSIS — R7989 Other specified abnormal findings of blood chemistry: Secondary | ICD-10-CM

## 2020-08-04 NOTE — Telephone Encounter (Signed)
Ok, have pt drink plenty of water 12-20 oz prior to labs and go today.  Pls order bmp.   Thx.   Dr. Ladona Ridgel

## 2020-08-04 NOTE — Telephone Encounter (Signed)
Patient has been informed of lab order placed per drs notes.

## 2020-08-04 NOTE — Telephone Encounter (Signed)
Dr Fayrene Fearing from [facility] pain [facility] called this morning in regards to patient critical lab values creatinine value 1.42 , hemolysis is suspected, however would like to have patient sent to lab for recheck today, pt has an appt upcoming, please advise

## 2020-08-05 LAB — BASIC METABOLIC PANEL
BUN/Creatinine Ratio: 11 (ref 10–24)
BUN: 13 mg/dL (ref 8–27)
CO2: 22 mmol/L (ref 20–29)
Calcium: 9.5 mg/dL (ref 8.6–10.2)
Chloride: 104 mmol/L (ref 96–106)
Creatinine, Ser: 1.2 mg/dL (ref 0.76–1.27)
Glucose: 144 mg/dL — ABNORMAL HIGH (ref 65–99)
Potassium: 4.2 mmol/L (ref 3.5–5.2)
Sodium: 141 mmol/L (ref 134–144)
eGFR: 66 mL/min/{1.73_m2} (ref >59)

## 2020-08-10 ENCOUNTER — Ambulatory Visit (INDEPENDENT_AMBULATORY_CARE_PROVIDER_SITE_OTHER): Payer: Medicare Other | Admitting: Family Medicine

## 2020-08-10 ENCOUNTER — Encounter: Payer: Self-pay | Admitting: Family Medicine

## 2020-08-10 ENCOUNTER — Other Ambulatory Visit: Payer: Self-pay

## 2020-08-10 VITALS — BP 128/68 | HR 83 | Temp 97.9°F | Ht 69.0 in | Wt 163.0 lb

## 2020-08-10 DIAGNOSIS — M4800 Spinal stenosis, site unspecified: Secondary | ICD-10-CM | POA: Insufficient documentation

## 2020-08-10 DIAGNOSIS — K219 Gastro-esophageal reflux disease without esophagitis: Secondary | ICD-10-CM

## 2020-08-10 DIAGNOSIS — M48061 Spinal stenosis, lumbar region without neurogenic claudication: Secondary | ICD-10-CM

## 2020-08-10 DIAGNOSIS — I10 Essential (primary) hypertension: Secondary | ICD-10-CM

## 2020-08-10 DIAGNOSIS — R011 Cardiac murmur, unspecified: Secondary | ICD-10-CM

## 2020-08-10 DIAGNOSIS — E875 Hyperkalemia: Secondary | ICD-10-CM

## 2020-08-10 DIAGNOSIS — F321 Major depressive disorder, single episode, moderate: Secondary | ICD-10-CM

## 2020-08-10 MED ORDER — OMEPRAZOLE 20 MG PO CPDR: DELAYED_RELEASE_CAPSULE | ORAL | 1 refills | Status: DC

## 2020-08-10 MED ORDER — AMLODIPINE BESYLATE 10 MG PO TABS: ORAL_TABLET | ORAL | 2 refills | Status: DC

## 2020-08-10 MED ORDER — FLUOXETINE HCL 20 MG PO CAPS: 20.0000 mg | ORAL_CAPSULE | Freq: Every day | ORAL | 1 refills | Status: DC

## 2020-08-10 MED ORDER — BENAZEPRIL HCL 20 MG PO TABS: ORAL_TABLET | ORAL | 2 refills | Status: DC

## 2020-08-10 NOTE — Patient Instructions (Signed)

## 2020-08-10 NOTE — Progress Notes (Signed)
Patient ID: Gary Harris, male    DOB: 08-11-1950, 70 y.o.   MRN: [id number]   Chief Complaint  Patient presents with  . Hypertension  . Gastroesophageal Reflux   Subjective:    HPI  F/u HTN, gerd- Pt stating went to [facility] and thought had abnormal blood sample with elevated K.  They wanted pt to recheck the labs, due to labs showing over 10 for K and calcium less than 1 and cr. 1.42.  Was seeing pain management and had labs done for them.  Has a h/o murmur- never had any imaging.  History father with 2nd heart attack at 25 and died.  Had early heart attack at 30s and heavy smoker.  Chronic pain- severe pain in feet radiating to the upper legs.  Went to [employer].  Had to work all day with being a Teacher, early years/pre.  Had to retire due to the pain in his legs/feet.  Has seen ortho in past.  Noticing more bilateral hand pain and thumb pain. Hard to grip pain and feeling some weakness with gripping.  Medical History Gary Harris has a past medical history of Arthritis, Bipolar disorder (HCC), Chronic pain, Depression, GERD (gastroesophageal reflux disease), and Hypertension.   Outpatient Encounter Medications as of 08/10/2020  Medication Sig  . famotidine (PEPCID) 20 MG tablet TAKE 1 TABLET(20 MG) BY MOUTH TWICE DAILY  . fexofenadine (ALLEGRA) 60 MG tablet Take 60 mg by mouth daily. PRN  . ibuprofen (ADVIL,MOTRIN) 200 MG tablet Take 200 mg by mouth every 6 (six) hours as needed for pain.  Marland Kitchen ketoconazole (NIZORAL) 2 % cream Apply 1 application topically 2 (two) times daily.  Marland Kitchen morphine (MS CONTIN) 60 MG 12 hr tablet Take 60 mg by mouth 2 (two) times daily.  . Multiple Vitamin (MULTIVITAMIN) tablet Take 1 tablet by mouth daily.  . Nasal Dilators (BREATHE RIGHT EXTRA) STRP by Does not apply route.  Marland Kitchen oxyCODONE-acetaminophen (PERCOCET) 10-325 MG per tablet Take 1 tablet by mouth every 4 (four) hours as needed for pain.  Marland Kitchen SALINE NASAL SPRAY NA Place into the nose.  . Simethicone  (GAS RELIEF PO) Take by mouth.  . tamsulosin (FLOMAX) 0.4 MG CAPS capsule Take 1 capsule (0.4 mg total) by mouth at bedtime.  . [DISCONTINUED] amLODipine (NORVASC) 10 MG tablet TAKE 1 TABLET(10 MG) BY MOUTH DAILY  . [DISCONTINUED] benazepril (LOTENSIN) 20 MG tablet TAKE 1 TABLET(20 MG) BY MOUTH DAILY  . [DISCONTINUED] FLUoxetine (PROZAC) 20 MG capsule Take 1 capsule (20 mg total) by mouth daily.  . [DISCONTINUED] omeprazole (PRILOSEC) 20 MG capsule take 2 capsule by mouth twice a day  . amLODipine (NORVASC) 10 MG tablet TAKE 1 TABLET(10 MG) BY MOUTH DAILY  . benazepril (LOTENSIN) 20 MG tablet TAKE 1 TABLET(20 MG) BY MOUTH DAILY  . FLUoxetine (PROZAC) 20 MG capsule Take 1 capsule (20 mg total) by mouth daily.  Marland Kitchen omeprazole (PRILOSEC) 20 MG capsule take 1 capsule by mouth twice a day  . [DISCONTINUED] zolpidem (AMBIEN) 10 MG tablet Take 1 tablet (10 mg total) by mouth at bedtime as needed for sleep.   No facility-administered encounter medications on file as of 08/10/2020.     Review of Systems  Constitutional: Negative for chills and fever.  HENT: Negative for congestion, rhinorrhea and sore throat.   Respiratory: Negative for cough, shortness of breath and wheezing.   Cardiovascular: Negative for chest pain and leg swelling.  Gastrointestinal: Negative for abdominal pain, diarrhea, nausea and vomiting.  Genitourinary: Negative  for dysuria and frequency.  Musculoskeletal: Positive for arthralgias (hand thumb pain).  Skin: Negative for rash.  Neurological: Negative for dizziness, weakness and headaches.     Vitals BP 128/68   Pulse 83   Temp 97.9 F (36.6 C)   Ht 5\' 9"  (1.753 m)   Wt 163 lb (73.9 kg)   SpO2 98%   BMI 24.07 kg/m   Objective:   Physical Exam Vitals and nursing note reviewed.  Constitutional:      General: He is not in acute distress.    Appearance: Normal appearance. He is not ill-appearing.  HENT:     Head: Normocephalic.     Nose: Nose normal. No  congestion.     Mouth/Throat:     Mouth: Mucous membranes are moist.     Pharynx: No oropharyngeal exudate.  Eyes:     Extraocular Movements: Extraocular movements intact.     Conjunctiva/sclera: Conjunctivae normal.     Pupils: Pupils are equal, round, and reactive to light.  Cardiovascular:     Rate and Rhythm: Normal rate and regular rhythm.     Pulses: Normal pulses.     Heart sounds: Murmur (systolic 3/6) heard.    Pulmonary:     Effort: Pulmonary effort is normal.     Breath sounds: Normal breath sounds. No wheezing, rhonchi or rales.  Musculoskeletal:        General: Normal range of motion.     Right lower leg: No edema.     Left lower leg: No edema.  Skin:    General: Skin is warm and dry.     Findings: No rash.  Neurological:     General: No focal deficit present.     Mental Status: He is alert and oriented to person, place, and time.     Cranial Nerves: No cranial nerve deficit.  Psychiatric:        Mood and Affect: Mood normal.        Behavior: Behavior normal.        Thought Content: Thought content normal.        Judgment: Judgment normal.      Assessment and Plan   1. Essential hypertension, benign - amLODipine (NORVASC) 10 MG tablet; TAKE 1 TABLET(10 MG) BY MOUTH DAILY  Dispense: 90 tablet; Refill: 2 - benazepril (LOTENSIN) 20 MG tablet; TAKE 1 TABLET(20 MG) BY MOUTH DAILY  Dispense: 90 tablet; Refill: 2  2. Systolic murmur - ECHOCARDIOGRAM COMPLETE; Future  3. Spinal stenosis of lumbar region, unspecified whether neurogenic claudication present  4. Hyperkalemia  5. Current moderate episode of major depressive disorder without prior episode (HCC) - FLUoxetine (PROZAC) 20 MG capsule; Take 1 capsule (20 mg total) by mouth daily.  Dispense: 90 capsule; Refill: 1  6. Gastroesophageal reflux disease without esophagitis - omeprazole (PRILOSEC) 20 MG capsule; take 1 capsule by mouth twice a day  Dispense: 360 capsule; Refill: 1   Hyperkalemia- Pt has  labs to get for our office, but needing the other labs done bc had an abnormal lab reading with elevated K and low Ca. Likely lab error and on repeat was all normal.  htn- stable. Cont meds.  Pain management for chronic neuropathic pain-  Pt taking ms Contin 60 bid  Oxycodone 10/325mg - q4hs prn.  Has had colonoscopy- normal.  Pt wanting to wait for colonosocopy for now.  H/o systolic murmur- no symptoms, will order echo for further evaluation.  gerd- stable. Cont meds. Discussed trying to decrease to every  other day dosing.  Bilateral hand pain/occ numbness- refer to ortho.    Return in about 6 months (around 02/10/2021), or if symptoms worsen or fail to improve, for f/u htn, gerd.   08/25/2020

## 2020-08-13 ENCOUNTER — Telehealth: Payer: Self-pay | Admitting: Family Medicine

## 2020-09-02 ENCOUNTER — Ambulatory Visit (HOSPITAL_COMMUNITY): Admission: RE | Admit: 2020-09-02 | Payer: Medicare Other | Source: Ambulatory Visit

## 2020-09-06 ENCOUNTER — Other Ambulatory Visit: Payer: Self-pay | Admitting: Family Medicine

## 2020-09-07 ENCOUNTER — Other Ambulatory Visit: Payer: Self-pay | Admitting: Family Medicine

## 2020-10-12 ENCOUNTER — Other Ambulatory Visit: Payer: Self-pay | Admitting: Family Medicine

## 2020-10-12 DIAGNOSIS — I10 Essential (primary) hypertension: Secondary | ICD-10-CM

## 2020-10-28 DIAGNOSIS — G894 Chronic pain syndrome: Secondary | ICD-10-CM | POA: Diagnosis not present

## 2020-11-22 ENCOUNTER — Telehealth: Payer: Self-pay

## 2020-11-22 NOTE — Telephone Encounter (Signed)
Left detailed vm on patient's listed mobile phone to contact the [facility] office during regular business hours to schedule his AWVI. Office contact information has been left on patient's vm.

## 2020-12-05 ENCOUNTER — Other Ambulatory Visit: Payer: Self-pay | Admitting: Family Medicine

## 2021-01-09 ENCOUNTER — Other Ambulatory Visit: Payer: Self-pay | Admitting: Family Medicine

## 2021-01-26 DIAGNOSIS — M25562 Pain in left knee: Secondary | ICD-10-CM | POA: Diagnosis not present

## 2021-02-03 DIAGNOSIS — M48061 Spinal stenosis, lumbar region without neurogenic claudication: Secondary | ICD-10-CM | POA: Diagnosis not present

## 2021-02-03 DIAGNOSIS — G894 Chronic pain syndrome: Secondary | ICD-10-CM | POA: Diagnosis not present

## 2021-02-03 DIAGNOSIS — M545 Low back pain, unspecified: Secondary | ICD-10-CM | POA: Diagnosis not present

## 2021-02-03 DIAGNOSIS — M542 Cervicalgia: Secondary | ICD-10-CM | POA: Diagnosis not present

## 2021-02-03 DIAGNOSIS — Z79891 Long term (current) use of opiate analgesic: Secondary | ICD-10-CM | POA: Diagnosis not present

## 2021-02-03 DIAGNOSIS — G8929 Other chronic pain: Secondary | ICD-10-CM | POA: Diagnosis not present

## 2021-02-04 ENCOUNTER — Other Ambulatory Visit: Payer: Self-pay | Admitting: Family Medicine

## 2021-02-25 ENCOUNTER — Encounter: Payer: Self-pay | Admitting: Family Medicine

## 2021-02-25 ENCOUNTER — Ambulatory Visit: Payer: Medicare Other | Admitting: Family Medicine

## 2021-03-05 ENCOUNTER — Ambulatory Visit (INDEPENDENT_AMBULATORY_CARE_PROVIDER_SITE_OTHER): Payer: Medicare Other | Admitting: Family Medicine

## 2021-03-05 ENCOUNTER — Other Ambulatory Visit: Payer: Self-pay

## 2021-03-05 ENCOUNTER — Encounter: Payer: Self-pay | Admitting: Family Medicine

## 2021-03-05 VITALS — BP 124/72 | HR 101 | Temp 98.0°F | Ht 69.0 in | Wt 163.4 lb

## 2021-03-05 DIAGNOSIS — E291 Testicular hypofunction: Secondary | ICD-10-CM

## 2021-03-05 DIAGNOSIS — R011 Cardiac murmur, unspecified: Secondary | ICD-10-CM | POA: Diagnosis not present

## 2021-03-05 DIAGNOSIS — I10 Essential (primary) hypertension: Secondary | ICD-10-CM

## 2021-03-05 DIAGNOSIS — I499 Cardiac arrhythmia, unspecified: Secondary | ICD-10-CM | POA: Diagnosis not present

## 2021-03-05 DIAGNOSIS — N400 Enlarged prostate without lower urinary tract symptoms: Secondary | ICD-10-CM | POA: Insufficient documentation

## 2021-03-05 DIAGNOSIS — K219 Gastro-esophageal reflux disease without esophagitis: Secondary | ICD-10-CM | POA: Diagnosis not present

## 2021-03-05 DIAGNOSIS — R350 Frequency of micturition: Secondary | ICD-10-CM

## 2021-03-05 DIAGNOSIS — Z125 Encounter for screening for malignant neoplasm of prostate: Secondary | ICD-10-CM

## 2021-03-05 DIAGNOSIS — Z862 Personal history of diseases of the blood and blood-forming organs and certain disorders involving the immune mechanism: Secondary | ICD-10-CM | POA: Diagnosis not present

## 2021-03-05 DIAGNOSIS — N401 Enlarged prostate with lower urinary tract symptoms: Secondary | ICD-10-CM

## 2021-03-05 DIAGNOSIS — E785 Hyperlipidemia, unspecified: Secondary | ICD-10-CM | POA: Diagnosis not present

## 2021-03-05 DIAGNOSIS — R739 Hyperglycemia, unspecified: Secondary | ICD-10-CM

## 2021-03-05 MED ORDER — OMEPRAZOLE 40 MG PO CPDR: 40.0000 mg | DELAYED_RELEASE_CAPSULE | Freq: Two times a day (BID) | ORAL | 3 refills | Status: DC

## 2021-03-05 MED ORDER — FAMOTIDINE 20 MG PO TABS
20.0000 mg | ORAL_TABLET | Freq: Two times a day (BID) | ORAL | 3 refills | Status: DC
Start: 2021-03-05 — End: 2022-01-03

## 2021-03-05 MED ORDER — TAMSULOSIN HCL 0.4 MG PO CAPS
0.4000 mg | ORAL_CAPSULE | Freq: Every day | ORAL | 3 refills | Status: DC
Start: 2021-03-05 — End: 2022-04-01

## 2021-03-05 MED ORDER — AMLODIPINE BESYLATE 10 MG PO TABS: 10.0000 mg | ORAL_TABLET | Freq: Every day | ORAL | 3 refills | Status: DC

## 2021-03-05 MED ORDER — BENAZEPRIL HCL 20 MG PO TABS: 20.0000 mg | ORAL_TABLET | Freq: Every day | ORAL | 3 refills | Status: DC

## 2021-03-05 NOTE — Assessment & Plan Note (Signed)
At goal.  Continue benazepril and amlodipine.

## 2021-03-05 NOTE — Assessment & Plan Note (Signed)
Stable. Continue Flomax. Refilled today. 

## 2021-03-05 NOTE — Progress Notes (Signed)
Subjective:  Patient ID: Gary Harris, male    DOB: November 10, 1950  Age: 70 y.o. MRN: [id number]  CC: Chief Complaint  Patient presents with   Establish Care    HPI:  69 year old male with hypertension, hyperlipidemia, BPH, chronic pain, GERD presents for follow-up.  Patient's hypertension is well controlled.  He is compliant with amlodipine and benazepril.  Patient states that his BPH has markedly improved following use of Flomax.  It has really improved his nocturia.  Patient has chronic pain and is followed by [facility] pain clinic.  GERD is stable.  However, he is taking 40 mg of Pepcid twice daily in addition to omeprazole.  Patient states that he would like his testosterone checked.  He is concerned about low T given his age.  Patient Active Problem List   Diagnosis Date Noted   BPH (benign prostatic hyperplasia) 03/05/2021   Murmur 03/05/2021   Hyperlipidemia 03/05/2021   Spinal stenosis 08/10/2020   Depression 05/03/2015   Chronic pain syndrome 02/22/2013   Essential hypertension, benign 02/22/2013   Esophageal reflux 02/22/2013    Social Hx   Social History   Socioeconomic History   Marital status: Married    Spouse name: Not on file   Number of children: Not on file   Years of education: Not on file   Highest education level: Not on file  Occupational History   Not on file  Tobacco Use   Smoking status: Never   Smokeless tobacco: Never  Substance and Sexual Activity   Alcohol use: No   Drug use: No   Sexual activity: Not on file  Other Topics Concern   Not on file  Social History Narrative   Not on file   Social Determinants of Health   Financial Resource Strain: Not on file  Food Insecurity: Not on file  Transportation Needs: Not on file  Physical Activity: Not on file  Stress: Not on file  Social Connections: Not on file    Review of Systems Per HPI  Objective:  BP 124/72    Pulse (!) 101    Temp 98 F (36.7 C)    Ht '5\' 9"'  (1.753 m)     Wt 163 lb 6.4 oz (74.1 kg)    SpO2 99%    BMI 24.13 kg/m   BP/Weight 03/05/2021 08/10/2020 40/98/1191  Systolic BP 478 295 621  Diastolic BP 72 68 67  Wt. (Lbs) 163.4 163 160  BMI 24.13 24.07 23.63    Physical Exam Vitals and nursing note reviewed.  Constitutional:      General: He is not in acute distress.    Appearance: Normal appearance. He is not ill-appearing.  HENT:     Head: Normocephalic and atraumatic.  Eyes:     General:        Right eye: No discharge.        Left eye: No discharge.     Conjunctiva/sclera: Conjunctivae normal.  Cardiovascular:     Rate and Rhythm: Normal rate. Rhythm irregular.     Heart sounds: Murmur heard.  Pulmonary:     Effort: Pulmonary effort is normal.     Breath sounds: Normal breath sounds. No wheezing, rhonchi or rales.  Neurological:     Mental Status: He is alert.  Psychiatric:        Mood and Affect: Mood normal.        Behavior: Behavior normal.    Lab Results  Component Value Date   WBC 9.6 04/28/2015  HGB 12.1 (L) 04/28/2015   HCT 35.5 (L) 04/28/2015   PLT 285 04/28/2015   GLUCOSE 144 (H) 08/04/2020   CHOL 186 11/10/2017   TRIG 170 (H) 11/10/2017   HDL 37 (L) 11/10/2017   LDLCALC 115 (H) 11/10/2017   ALT 6 11/10/2017   AST 12 11/10/2017   NA 141 08/04/2020   K 4.2 08/04/2020   CL 104 08/04/2020   CREATININE 1.20 08/04/2020   BUN 13 08/04/2020   CO2 22 08/04/2020   TSH 2.289 12/20/2009   PSA 0.82 12/23/2013   EKG: Interpretation -normal sinus rhythm with rate of 78.  Normal axis.  Normal intervals.  No ST or T wave changes.  Normal EKG.  Assessment & Plan:   Problem List Items Addressed This Visit       Cardiovascular and Mediastinum   Essential hypertension, benign - Primary    At goal.  Continue benazepril and amlodipine.      Relevant Medications   amLODipine (NORVASC) 10 MG tablet   benazepril (LOTENSIN) 20 MG tablet   Other Relevant Orders   CMP14+EGFR     Digestive   Esophageal reflux     Stable.  However, he is taking 40 mg of Pepcid twice daily.  Decreasing to 20 mg twice daily.  Increasing omeprazole to 40 mg twice daily.      Relevant Medications   famotidine (PEPCID) 20 MG tablet   omeprazole (PRILOSEC) 40 MG capsule     Genitourinary   BPH (benign prostatic hyperplasia)    Stable.  Continue Flomax.  Refilled today.      Relevant Medications   tamsulosin (FLOMAX) 0.4 MG CAPS capsule     Other   Murmur    Noted on exam today.  EKG was obtained due to irregularity heard on auscultation.  EKG was normal.  Advised to consider echo in the future.      Hyperlipidemia   Relevant Medications   amLODipine (NORVASC) 10 MG tablet   benazepril (LOTENSIN) 20 MG tablet   Other Relevant Orders   Lipid panel   Other Visit Diagnoses     Prostate cancer screening       Relevant Orders   PSA   Blood glucose elevated       Relevant Orders   Hemoglobin A1c   History of anemia       Relevant Orders   CBC   Irregular heart beat       Relevant Orders   EKG 12-Lead (Completed)   Hypogonadism in male       Relevant Orders   Testosterone,Free and Total       Meds ordered this encounter  Medications   amLODipine (NORVASC) 10 MG tablet    Sig: Take 1 tablet (10 mg total) by mouth daily.    Dispense:  90 tablet    Refill:  3   benazepril (LOTENSIN) 20 MG tablet    Sig: Take 1 tablet (20 mg total) by mouth daily. TAKE 1 TABLET(20 MG) BY MOUTH DAILY    Dispense:  90 tablet    Refill:  3   famotidine (PEPCID) 20 MG tablet    Sig: Take 1 tablet (20 mg total) by mouth 2 (two) times daily.    Dispense:  180 tablet    Refill:  3    NEEDS OFFICE VISIT   omeprazole (PRILOSEC) 40 MG capsule    Sig: Take 1 capsule (40 mg total) by mouth 2 (two) times daily before a meal.  Dispense:  180 capsule    Refill:  3   tamsulosin (FLOMAX) 0.4 MG CAPS capsule    Sig: Take 1 capsule (0.4 mg total) by mouth daily.    Dispense:  90 capsule    Refill:  3    Follow-up:  6  months  Dona Ana

## 2021-03-05 NOTE — Assessment & Plan Note (Signed)
Noted on exam today.  EKG was obtained due to irregularity heard on auscultation.  EKG was normal.  Advised to consider echo in the future.

## 2021-03-05 NOTE — Patient Instructions (Signed)
I have refilled your medication.  Labs (fasting) when you like.  Consider echo in the future.  Follow up in 6 months.  Take care  Dr. Adriana Simas

## 2021-03-05 NOTE — Assessment & Plan Note (Signed)
Stable.  However, he is taking 40 mg of Pepcid twice daily.  Decreasing to 20 mg twice daily.  Increasing omeprazole to 40 mg twice daily.

## 2021-03-11 NOTE — Telephone Encounter (Signed)
Had follow up on 03/05/21

## 2021-03-30 ENCOUNTER — Ambulatory Visit (INDEPENDENT_AMBULATORY_CARE_PROVIDER_SITE_OTHER): Payer: Medicare Other

## 2021-03-30 ENCOUNTER — Other Ambulatory Visit: Payer: Self-pay

## 2021-03-30 VITALS — Ht 69.0 in | Wt 163.0 lb

## 2021-03-30 DIAGNOSIS — Z1212 Encounter for screening for malignant neoplasm of rectum: Secondary | ICD-10-CM | POA: Diagnosis not present

## 2021-03-30 DIAGNOSIS — Z1211 Encounter for screening for malignant neoplasm of colon: Secondary | ICD-10-CM

## 2021-03-30 DIAGNOSIS — Z0000 Encounter for general adult medical examination without abnormal findings: Secondary | ICD-10-CM

## 2021-03-30 NOTE — Patient Instructions (Signed)
[name] , Thank you for taking time to come for your Medicare Wellness Visit. I appreciate your ongoing commitment to your health goals. Please review the following plan we discussed and let me know if I can assist you in the future.   Screening recommendations/referrals: Colonoscopy: Order placed for Cologuard today.  Recommended yearly ophthalmology/optometry visit for glaucoma screening and checkup Recommended yearly dental visit for hygiene and checkup  Vaccinations: Influenza vaccine: Done 02/12/2021 Repeat annually  Pneumococcal vaccine: Due. Tdap vaccine: Due Repeat in 10 years  Shingles vaccine: Shingrix discussed. Please contact your pharmacy for coverage information.     Covid-19: Done. Please bring copy of card to next visit, so vaccines can be documented.  Advanced directives: Please bring a copy of your health care power of attorney and living will to the office to be added to your chart at your convenience.   Conditions/risks identified: Aim for 30 minutes of exercise or brisk walking each day, drink 6-8 glasses of water and eat lots of fruits and vegetables.   Next appointment: Follow up in one year for your annual wellness visit. 2024  Preventive Care 69 Years and Older, Male  Preventive care refers to lifestyle choices and visits with your health care provider that can promote health and wellness. What does preventive care include? A yearly physical exam. This is also called an annual well check. Dental exams once or twice a year. Routine eye exams. Ask your health care provider how often you should have your eyes checked. Personal lifestyle choices, including: Daily care of your teeth and gums. Regular physical activity. Eating a healthy diet. Avoiding tobacco and drug use. Limiting alcohol use. Practicing safe sex. Taking low doses of aspirin every day. Taking vitamin and mineral supplements as recommended by your health care provider. What happens  during an annual well check? The services and screenings done by your health care provider during your annual well check will depend on your age, overall health, lifestyle risk factors, and family history of disease. Counseling  Your health care provider may ask you questions about your: Alcohol use. Tobacco use. Drug use. Emotional well-being. Home and relationship well-being. Sexual activity. Eating habits. History of falls. Memory and ability to understand (cognition). Work and work Astronomer. Screening  You may have the following tests or measurements: Height, weight, and BMI. Blood pressure. Lipid and cholesterol levels. These may be checked every 5 years, or more frequently if you are over 21 years old. Skin check. Lung cancer screening. You may have this screening every year starting at age 69 if you have a 30-pack-year history of smoking and currently smoke or have quit within the past 15 years. Fecal occult blood test (FOBT) of the stool. You may have this test every year starting at age 69. Flexible sigmoidoscopy or colonoscopy. You may have a sigmoidoscopy every 5 years or a colonoscopy every 10 years starting at age 69. Prostate cancer screening. Recommendations will vary depending on your family history and other risks. Hepatitis C blood test. Hepatitis B blood test. Sexually transmitted disease (STD) testing. Diabetes screening. This is done by checking your blood sugar (glucose) after you have not eaten for a while (fasting). You may have this done every 1-3 years. Abdominal aortic aneurysm (AAA) screening. You may need this if you are a current or former smoker. Osteoporosis. You may be screened starting at age 69 if you are at high risk. Talk with your health care provider about your test results, treatment options, and  if necessary, the need for more tests. Vaccines  Your health care provider may recommend certain vaccines, such as: Influenza vaccine. This is  recommended every year. Tetanus, diphtheria, and acellular pertussis (Tdap, Td) vaccine. You may need a Td booster every 10 years. Zoster vaccine. You may need this after age 25. Pneumococcal 13-valent conjugate (PCV13) vaccine. One dose is recommended after age 69. Pneumococcal polysaccharide (PPSV23) vaccine. One dose is recommended after age 69. Talk to your health care provider about which screenings and vaccines you need and how often you need them. This information is not intended to replace advice given to you by your health care provider. Make sure you discuss any questions you have with your health care provider. Document Released: 04/03/2015 Document Revised: 11/25/2015 Document Reviewed: 01/06/2015 Elsevier Interactive Patient Education  2017 [place] Prevention in the Home Falls can cause injuries. They can happen to people of all ages. There are many things you can do to make your home safe and to help prevent falls. What can I do on the outside of my home? Regularly fix the edges of walkways and driveways and fix any cracks. Remove anything that might make you trip as you walk through a door, such as a raised step or threshold. Trim any bushes or trees on the path to your home. Use bright outdoor lighting. Clear any walking paths of anything that might make someone trip, such as rocks or tools. Regularly check to see if handrails are loose or broken. Make sure that both sides of any steps have handrails. Any raised decks and porches should have guardrails on the edges. Have any leaves, snow, or ice cleared regularly. Use sand or salt on walking paths during winter. Clean up any spills in your garage right away. This includes oil or grease spills. What can I do in the bathroom? Use night lights. Install grab bars by the toilet and in the tub and shower. Do not use towel bars as grab bars. Use non-skid mats or decals in the tub or shower. If you need to sit down in  the shower, use a plastic, non-slip stool. Keep the floor dry. Clean up any water that spills on the floor as soon as it happens. Remove soap buildup in the tub or shower regularly. Attach bath mats securely with double-sided non-slip rug tape. Do not have throw rugs and other things on the floor that can make you trip. What can I do in the bedroom? Use night lights. Make sure that you have a light by your bed that is easy to reach. Do not use any sheets or blankets that are too big for your bed. They should not hang down onto the floor. Have a firm chair that has side arms. You can use this for support while you get dressed. Do not have throw rugs and other things on the floor that can make you trip. What can I do in the kitchen? Clean up any spills right away. Avoid walking on wet floors. Keep items that you use a lot in easy-to-reach places. If you need to reach something above you, use a strong step stool that has a grab bar. Keep electrical cords out of the way. Do not use floor polish or wax that makes floors slippery. If you must use wax, use non-skid floor wax. Do not have throw rugs and other things on the floor that can make you trip. What can I do with my stairs? Do not leave any  items on the stairs. Make sure that there are handrails on both sides of the stairs and use them. Fix handrails that are broken or loose. Make sure that handrails are as long as the stairways. Check any carpeting to make sure that it is firmly attached to the stairs. Fix any carpet that is loose or worn. Avoid having throw rugs at the top or bottom of the stairs. If you do have throw rugs, attach them to the floor with carpet tape. Make sure that you have a light switch at the top of the stairs and the bottom of the stairs. If you do not have them, ask someone to add them for you. What else can I do to help prevent falls? Wear shoes that: Do not have high heels. Have rubber bottoms. Are comfortable  and fit you well. Are closed at the toe. Do not wear sandals. If you use a stepladder: Make sure that it is fully opened. Do not climb a closed stepladder. Make sure that both sides of the stepladder are locked into place. Ask someone to hold it for you, if possible. Clearly mark and make sure that you can see: Any grab bars or handrails. First and last steps. Where the edge of each step is. Use tools that help you move around (mobility aids) if they are needed. These include: Canes. Walkers. Scooters. Crutches. Turn on the lights when you go into a dark area. Replace any light bulbs as soon as they burn out. Set up your furniture so you have a clear path. Avoid moving your furniture around. If any of your floors are uneven, fix them. If there are any pets around you, be aware of where they are. Review your medicines with your doctor. Some medicines can make you feel dizzy. This can increase your chance of falling. Ask your doctor what other things that you can do to help prevent falls. This information is not intended to replace advice given to you by your health care provider. Make sure you discuss any questions you have with your health care provider. Document Released: 01/01/2009 Document Revised: 08/13/2015 Document Reviewed: 04/11/2014 Elsevier Interactive Patient Education  2017 [employer].

## 2021-03-30 NOTE — Progress Notes (Signed)
Subjective:   Gary Harris is a 71 y.o. male who presents for Medicare Annual/Subsequent preventive examination. Virtual Visit via Telephone Note  I connected with  Gary Harris on 03/30/21 at 11:00 AM EST by telephone and verified that I am speaking with the correct person using two identifiers.  Location: Patient: Home Provider: [facility] Persons participating in the virtual visit: patient/Nurse Health Advisor   I discussed the limitations, risks, security and privacy concerns of performing an evaluation and management service by telephone and the availability of in person appointments. The patient expressed understanding and agreed to proceed.  Interactive audio and video telecommunications were attempted between this nurse and patient, however failed, due to patient having technical difficulties OR patient did not have access to video capability.  We continued and completed visit with audio only.  Some vital signs may be absent or patient reported.   Darral DashMary Jane K Detria Cummings, LPN  Review of Systems     Cardiac Risk Factors include: advanced age (>655men, 70>65 women);hypertension;dyslipidemia;male gender;sedentary lifestyle;Other (see comment), Risk factor comments: Spinal Stenosis    PHONE VISIT. PT AT HOME. NURSE AT [facility]. Objective:    Today's Vitals   03/30/21 1055 03/30/21 1057  Weight: 163 lb (73.9 kg)   Height: 5\' 9"  (1.753 m)   PainSc:  8    Body mass index is 24.07 kg/m.  Advanced Directives 03/30/2021 02/17/2020  Does Patient Have a Medical Advance Directive? Yes Yes  Type of Estate agentAdvance Directive Healthcare Power of GarfieldAttorney;Living will Living will;Healthcare Power of Attorney  Copy of Healthcare Power of Attorney in Chart? No - copy requested -    Current Medications (verified) Outpatient Encounter Medications as of 03/30/2021  Medication Sig   amLODipine (NORVASC) 10 MG tablet Take 1 tablet (10 mg total) by mouth daily.   benazepril (LOTENSIN) 20 MG tablet Take  1 tablet (20 mg total) by mouth daily. TAKE 1 TABLET(20 MG) BY MOUTH DAILY   famotidine (PEPCID) 20 MG tablet Take 1 tablet (20 mg total) by mouth 2 (two) times daily.   fexofenadine (ALLEGRA) 60 MG tablet Take by mouth.   FLUoxetine (PROZAC) 20 MG capsule Take 1 capsule by mouth daily.   morphine (MS CONTIN) 60 MG 12 hr tablet Take 60 mg by mouth 2 (two) times daily.   naloxone (NARCAN) nasal spray 4 mg/0.1 mL One spray in either nostril once for known/suspected opioid overdose. May repeat every 2-3 minutes in alternating nostril til EMS arrives   omeprazole (PRILOSEC) 20 MG capsule Take by mouth.   oxyCODONE-acetaminophen (PERCOCET) 10-325 MG per tablet Take 1 tablet by mouth every 4 (four) hours as needed for pain.   SALINE NASAL SPRAY NA Place into the nose.   Simethicone (GAS RELIEF PO) Take by mouth.   tamsulosin (FLOMAX) 0.4 MG CAPS capsule Take 1 capsule (0.4 mg total) by mouth daily.   [DISCONTINUED] acetaminophen (TYLENOL) 500 MG tablet Take by mouth.   [DISCONTINUED] fexofenadine (ALLEGRA) 60 MG tablet Take 60 mg by mouth daily. PRN   [DISCONTINUED] FLUoxetine (PROZAC) 20 MG capsule Take 1 capsule (20 mg total) by mouth daily.   [DISCONTINUED] ibuprofen (ADVIL,MOTRIN) 200 MG tablet Take 200 mg by mouth every 6 (six) hours as needed for pain.   [DISCONTINUED] Multiple Vitamin (MULTIVITAMIN) tablet Take 1 tablet by mouth daily.   [DISCONTINUED] omeprazole (PRILOSEC) 40 MG capsule Take 1 capsule (40 mg total) by mouth 2 (two) times daily before a meal.   Fe-Succ Ac-B Cmplx-C-Ca-FA (IROSPAN 24/6) MISC  Take 1 tablet by mouth daily.   No facility-administered encounter medications on file as of 03/30/2021.    Allergies (verified) Effexor xr [venlafaxine hcl er], Paxil [paroxetine hcl], and Clindamycin/lincomycin   History: Past Medical History:  Diagnosis Date   Arthritis    Bipolar disorder (HCC)    Chronic pain    Depression    GERD (gastroesophageal reflux disease)     Hypertension    Past Surgical History:  Procedure Laterality Date   COLONOSCOPY     TONSILLECTOMY     Family History  Problem Relation Age of Onset   Heart attack Father    Hypertension Mother    Hypertension Sister    Cancer Other        breast   Social History   Socioeconomic History   Marital status: Married    Spouse name: Darl Pikes   Number of children: Not on file   Years of education: Not on file   Highest education level: Not on file  Occupational History   Not on file  Tobacco Use   Smoking status: Never   Smokeless tobacco: Never  Substance and Sexual Activity   Alcohol use: No   Drug use: No   Sexual activity: Not on file  Other Topics Concern   Not on file  Social History Narrative   Married x 27 years.   Social Determinants of Health   Financial Resource Strain: Low Risk    Difficulty of Paying Living Expenses: Not hard at all  Food Insecurity: No Food Insecurity   Worried About Programme researcher, broadcasting/film/video in the Last Year: Never true   Ran Out of Food in the Last Year: Never true  Transportation Needs: No Transportation Needs   Lack of Transportation (Medical): No   Lack of Transportation (Non-Medical): No  Physical Activity: Insufficiently Active   Days of Exercise per Week: 5 days   Minutes of Exercise per Session: 20 min  Stress: No Stress Concern Present   Feeling of Stress : Not at all  Social Connections: Moderately Isolated   Frequency of Communication with Friends and Family: More than three times a week   Frequency of Social Gatherings with Friends and Family: More than three times a week   Attends Religious Services: Never   Database administrator or Organizations: No   Attends Engineer, structural: Never   Marital Status: Married    Tobacco Counseling Counseling given: Not Answered   Clinical Intake:  Pre-visit preparation completed: Yes  Pain : 0-10 Pain Score: 8  Pain Type: Chronic pain Pain Location: Back Pain  Descriptors / Indicators: Aching Pain Onset: More than a month ago Pain Frequency: Intermittent     BMI - recorded: 24.07 Nutritional Status: BMI of 19-24  Normal Nutritional Risks: None Diabetes: No  How often do you need to have someone help you when you read instructions, pamphlets, or other written materials from your doctor or pharmacy?: 1 - Never  Diabetic?NO  Interpreter Needed?: No  Information entered by :: Mj Christiana Gurevich LPN   Activities of Daily Living In your present state of health, do you have any difficulty performing the following activities: 03/30/2021  Hearing? N  Vision? N  Difficulty concentrating or making decisions? N  Walking or climbing stairs? N  Dressing or bathing? N  Doing errands, shopping? N  Preparing Food and eating ? N  Using the Toilet? N  In the past six months, have you accidently leaked urine? N  Do you have problems with loss of bowel control? N  Managing your Medications? N  Managing your Finances? N  Housekeeping or managing your Housekeeping? N  Some recent data might be hidden    Patient Care Team: Tommie Sams, DO as PCP - General (Family Medicine)  Indicate any recent Medical Services you may have received from other than [facility] providers in the past year (date may be approximate).     Assessment:   This is a routine wellness examination for Gary Harris.  Hearing/Vision screen Hearing Screening - Comments:: No hearing issues.  Vision Screening - Comments:: Glasses. Dr. Cathey Endow in [place]. 04/2020  Dietary issues and exercise activities discussed: Current Exercise Habits: Home exercise routine, Type of exercise: walking;stretching, Time (Minutes): 20, Frequency (Times/Week): 5, Weekly Exercise (Minutes/Week): 100, Intensity: Mild, Exercise limited by: cardiac condition(s);orthopedic condition(s)   Goals Addressed             This Visit's Progress    Exercise 3x per week (30 min per time)       Would like to increase  exercise.       Depression Screen PHQ 2/9 Scores 03/30/2021 03/05/2021 08/10/2020 08/09/2017 01/12/2017  PHQ - 2 Score 1 2 0 1 0  PHQ- 9 Score 2 3 - 1 -    Fall Risk Fall Risk  03/30/2021 08/10/2020 01/12/2017  Falls in the past year? 0 0 No  Number falls in past yr: 0 0 -  Injury with Fall? 0 0 -  Risk for fall due to : Impaired mobility - -  Follow up Falls prevention discussed Falls evaluation completed -    FALL RISK PREVENTION PERTAINING TO THE HOME:  Any stairs in or around the home? Yes  If so, are there any without handrails? No  Home free of loose throw rugs in walkways, pet beds, electrical cords, etc? Yes  Adequate lighting in your home to reduce risk of falls? Yes   ASSISTIVE DEVICES UTILIZED TO PREVENT FALLS:  Life alert? No  Use of a cane, walker or w/c? No  Grab bars in the bathroom? No  Shower chair or bench in shower? No  Elevated toilet seat or a handicapped toilet? No   TIMED UP AND GO:  Was the test performed? No .  Phone visit.  Cognitive Function:     6CIT Screen 03/30/2021  What Year? 0 points  What month? 0 points  What time? 0 points  Count back from 20 0 points  Months in reverse 0 points  Repeat phrase 0 points  Total Score 0    Immunizations Immunization History  Administered Date(s) Administered   Fluad Quad(high Dose 65+) 12/17/2018   Influenza, High Dose Seasonal PF 01/23/2018   Influenza,inj,Quad PF,6+ Mos 12/20/2013, 01/12/2017   Influenza-Unspecified 01/19/2012, 12/12/2012, 12/21/2014, 12/31/2015   Zoster, Live 04/29/2013    TDAP status: Due, Education has been provided regarding the importance of this vaccine. Advised may receive this vaccine at local pharmacy or Health Dept. Aware to provide a copy of the vaccination record if obtained from local pharmacy or Health Dept. Verbalized acceptance and understanding.  Flu Vaccine status: Up to date  Pneumococcal vaccine status: Due, Education has been provided regarding the  importance of this vaccine. Advised may receive this vaccine at local pharmacy or Health Dept. Aware to provide a copy of the vaccination record if obtained from local pharmacy or Health Dept. Verbalized acceptance and understanding.  Covid-19 vaccine status: Completed vaccines  Qualifies for Shingles Vaccine? Yes  Zostavax completed Yes   Shingrix Completed?: No.    Education has been provided regarding the importance of this vaccine. Patient has been advised to call insurance company to determine out of pocket expense if they have not yet received this vaccine. Advised may also receive vaccine at local pharmacy or Health Dept. Verbalized acceptance and understanding.  Screening Tests Health Maintenance  Topic Date Due   COVID-19 Vaccine (1) Never done   Pneumonia Vaccine 10665+ Years old (1 - PCV) Never done   Hepatitis C Screening  Never done   TETANUS/TDAP  Never done   Zoster Vaccines- Shingrix (1 of 2) Never done   INFLUENZA VACCINE  10/19/2020   COLONOSCOPY (Pts 45-1055yrs Insurance coverage will need to be confirmed)  02/10/2022 (Originally 04/29/1997)   HPV VACCINES  Aged Out    Health Maintenance  Health Maintenance Due  Topic Date Due   COVID-19 Vaccine (1) Never done   Pneumonia Vaccine 5765+ Years old (1 - PCV) Never done   Hepatitis C Screening  Never done   TETANUS/TDAP  Never done   Zoster Vaccines- Shingrix (1 of 2) Never done   INFLUENZA VACCINE  10/19/2020    Colorectal cancer screening: Referral to GI placed 03/30/2021, Cologuard ordered. Pt aware the office will call re: appt.  Lung Cancer Screening: (Low Dose CT Chest recommended if Age 88-80 years, 30 pack-year currently smoking OR have quit w/in 15years.) does not qualify.    Additional Screening:  Hepatitis C Screening: does qualify; Completed Due  Vision Screening: Recommended annual ophthalmology exams for early detection of glaucoma and other disorders of the eye. Is the patient up to date with their  annual eye exam?  Yes  Who is the provider or what is the name of the office in which the patient attends annual eye exams? [place]. [place]. If pt is not established with a provider, would they like to be referred to a provider to establish care? No .   Dental Screening: Recommended annual dental exams for proper oral hygiene  Community Resource Referral / Chronic Care Management: CRR required this visit?  No   CCM required this visit?  No      Plan:     I have personally reviewed and noted the following in the patients chart:   Medical and social history Use of alcohol, tobacco or illicit drugs  Current medications and supplements including opioid prescriptions. Patient is currently taking opioid prescriptions. Information provided to patient regarding non-opioid alternatives. Patient advised to discuss non-opioid treatment plan with their provider. Functional ability and status Nutritional status Physical activity Advanced directives List of other physicians Hospitalizations, surgeries, and ER visits in previous 12 months Vitals Screenings to include cognitive, depression, and falls Referrals and appointments  In addition, I have reviewed and discussed with patient certain preventive protocols, quality metrics, and best practice recommendations. A written personalized care plan for preventive services as well as general preventive health recommendations were provided to patient.     Darral DashMary Jane K Mavryk Pino, LPN   1/61/09601/12/2021   Nurse Notes: Cologuard order placed. Pt states it has been "several years" since colonoscopy. Discussed Shingrix and Prevnar vaccines and how to obtain. Pt to bring copy of Covid vaccine card for documentation of dates. Pt verbalized understanding of all.

## 2021-07-05 ENCOUNTER — Other Ambulatory Visit: Payer: Self-pay | Admitting: Family Medicine

## 2021-07-05 DIAGNOSIS — I10 Essential (primary) hypertension: Secondary | ICD-10-CM

## 2021-09-03 ENCOUNTER — Ambulatory Visit: Payer: Self-pay | Admitting: Family Medicine

## 2021-09-28 ENCOUNTER — Other Ambulatory Visit: Payer: Self-pay | Admitting: Family Medicine

## 2021-09-28 DIAGNOSIS — I10 Essential (primary) hypertension: Secondary | ICD-10-CM

## 2021-12-31 ENCOUNTER — Other Ambulatory Visit: Payer: Self-pay | Admitting: Family Medicine

## 2022-01-19 ENCOUNTER — Other Ambulatory Visit: Payer: Self-pay | Admitting: Family Medicine

## 2022-01-19 DIAGNOSIS — I10 Essential (primary) hypertension: Secondary | ICD-10-CM

## 2022-02-21 ENCOUNTER — Other Ambulatory Visit: Payer: Self-pay | Admitting: Family Medicine

## 2022-02-21 DIAGNOSIS — K219 Gastro-esophageal reflux disease without esophagitis: Secondary | ICD-10-CM

## 2022-03-31 ENCOUNTER — Other Ambulatory Visit: Payer: Self-pay | Admitting: Family Medicine

## 2022-03-31 DIAGNOSIS — I10 Essential (primary) hypertension: Secondary | ICD-10-CM

## 2022-04-14 NOTE — Patient Instructions (Incomplete)
Gary Harris , Thank you for taking time to come for your Medicare Wellness Visit. I appreciate your ongoing commitment to your health goals. Please review the following plan we discussed and let me know if I can assist you in the future.   These are the goals we discussed:  Goals      Exercise 3x per week (30 min per time)     Would like to increase exercise.        This is a list of the screening recommended for you and due dates:  Health Maintenance  Topic Date Due   COVID-19 Vaccine (1) Never done   Hepatitis C Screening: USPSTF Recommendation to screen - Ages 49-79 yo.  Never done   DTaP/Tdap/Td vaccine (1 - Tdap) Never done   Zoster (Shingles) Vaccine (1 of 2) Never done   Colon Cancer Screening  Never done   Pneumonia Vaccine (1 - PCV) Never done   Flu Shot  10/19/2021   Medicare Annual Wellness Visit  03/30/2022   HPV Vaccine  Aged Out    Advanced directives: ***  Conditions/risks identified: Aim for 30 minutes of exercise or brisk walking, 6-8 glasses of water, and 5 servings of fruits and vegetables each day.   Next appointment: Follow up in one year for your annual wellness visit.   Preventive Care 70 Years and Older, Male  Preventive care refers to lifestyle choices and visits with your health care provider that can promote health and wellness. What does preventive care include? A yearly physical exam. This is also called an annual well check. Dental exams once or twice a year. Routine eye exams. Ask your health care provider how often you should have your eyes checked. Personal lifestyle choices, including: Daily care of your teeth and gums. Regular physical activity. Eating a healthy diet. Avoiding tobacco and drug use. Limiting alcohol use. Practicing safe sex. Taking low doses of aspirin every day. Taking vitamin and mineral supplements as recommended by your health care provider. What happens during an annual well check? The services and screenings  done by your health care provider during your annual well check will depend on your age, overall health, lifestyle risk factors, and family history of disease. Counseling  Your health care provider may ask you questions about your: Alcohol use. Tobacco use. Drug use. Emotional well-being. Home and relationship well-being. Sexual activity. Eating habits. History of falls. Memory and ability to understand (cognition). Work and work Statistician. Screening  You may have the following tests or measurements: Height, weight, and BMI. Blood pressure. Lipid and cholesterol levels. These may be checked every 5 years, or more frequently if you are over 65 years old. Skin check. Lung cancer screening. You may have this screening every year starting at age 70 if you have a 30-pack-year history of smoking and currently smoke or have quit within the past 15 years. Fecal occult blood test (FOBT) of the stool. You may have this test every year starting at age 70. Flexible sigmoidoscopy or colonoscopy. You may have a sigmoidoscopy every 5 years or a colonoscopy every 10 years starting at age 81. Prostate cancer screening. Recommendations will vary depending on your family history and other risks. Hepatitis C blood test. Hepatitis B blood test. Sexually transmitted disease (STD) testing. Diabetes screening. This is done by checking your blood sugar (glucose) after you have not eaten for a while (fasting). You may have this done every 1-3 years. Abdominal aortic aneurysm (AAA) screening. You may need this  if you are a current or former smoker. Osteoporosis. You may be screened starting at age 70 if you are at high risk. Talk with your health care provider about your test results, treatment options, and if necessary, the need for more tests. Vaccines  Your health care provider may recommend certain vaccines, such as: Influenza vaccine. This is recommended every year. Tetanus, diphtheria, and acellular  pertussis (Tdap, Td) vaccine. You may need a Td booster every 10 years. Zoster vaccine. You may need this after age 70. Pneumococcal 13-valent conjugate (PCV13) vaccine. One dose is recommended after age 70. Pneumococcal polysaccharide (PPSV23) vaccine. One dose is recommended after age 76. Talk to your health care provider about which screenings and vaccines you need and how often you need them. This information is not intended to replace advice given to you by your health care provider. Make sure you discuss any questions you have with your health care provider. Document Released: 04/03/2015 Document Revised: 11/25/2015 Document Reviewed: 01/06/2015 Elsevier Interactive Patient Education  2017 [employer] Prevention in the Home Falls can cause injuries. They can happen to people of all ages. There are many things you can do to make your home safe and to help prevent falls. What can I do on the outside of my home? Regularly fix the edges of walkways and driveways and fix any cracks. Remove anything that might make you trip as you walk through a door, such as a raised step or threshold. Trim any bushes or trees on the path to your home. Use bright outdoor lighting. Clear any walking paths of anything that might make someone trip, such as rocks or tools. Regularly check to see if handrails are loose or broken. Make sure that both sides of any steps have handrails. Any raised decks and porches should have guardrails on the edges. Have any leaves, snow, or ice cleared regularly. Use sand or salt on walking paths during winter. Clean up any spills in your garage right away. This includes oil or grease spills. What can I do in the bathroom? Use night lights. Install grab bars by the toilet and in the tub and shower. Do not use towel bars as grab bars. Use non-skid mats or decals in the tub or shower. If you need to sit down in the shower, use a plastic, non-slip stool. Keep the floor  dry. Clean up any water that spills on the floor as soon as it happens. Remove soap buildup in the tub or shower regularly. Attach bath mats securely with double-sided non-slip rug tape. Do not have throw rugs and other things on the floor that can make you trip. What can I do in the bedroom? Use night lights. Make sure that you have a light by your bed that is easy to reach. Do not use any sheets or blankets that are too big for your bed. They should not hang down onto the floor. Have a firm chair that has side arms. You can use this for support while you get dressed. Do not have throw rugs and other things on the floor that can make you trip. What can I do in the kitchen? Clean up any spills right away. Avoid walking on wet floors. Keep items that you use a lot in easy-to-reach places. If you need to reach something above you, use a strong step stool that has a grab bar. Keep electrical cords out of the way. Do not use floor polish or wax that makes floors slippery.  If you must use wax, use non-skid floor wax. Do not have throw rugs and other things on the floor that can make you trip. What can I do with my stairs? Do not leave any items on the stairs. Make sure that there are handrails on both sides of the stairs and use them. Fix handrails that are broken or loose. Make sure that handrails are as long as the stairways. Check any carpeting to make sure that it is firmly attached to the stairs. Fix any carpet that is loose or worn. Avoid having throw rugs at the top or bottom of the stairs. If you do have throw rugs, attach them to the floor with carpet tape. Make sure that you have a light switch at the top of the stairs and the bottom of the stairs. If you do not have them, ask someone to add them for you. What else can I do to help prevent falls? Wear shoes that: Do not have high heels. Have rubber bottoms. Are comfortable and fit you well. Are closed at the toe. Do not wear  sandals. If you use a stepladder: Make sure that it is fully opened. Do not climb a closed stepladder. Make sure that both sides of the stepladder are locked into place. Ask someone to hold it for you, if possible. Clearly Shellie and make sure that you can see: Any grab bars or handrails. First and last steps. Where the edge of each step is. Use tools that help you move around (mobility aids) if they are needed. These include: Canes. Walkers. Scooters. Crutches. Turn on the lights when you go into a dark area. Replace any light bulbs as soon as they burn out. Set up your furniture so you have a clear path. Avoid moving your furniture around. If any of your floors are uneven, fix them. If there are any pets around you, be aware of where they are. Review your medicines with your doctor. Some medicines can make you feel dizzy. This can increase your chance of falling. Ask your doctor what other things that you can do to help prevent falls. This information is not intended to replace advice given to you by your health care provider. Make sure you discuss any questions you have with your health care provider. Document Released: 01/01/2009 Document Revised: 08/13/2015 Document Reviewed: 04/11/2014 Elsevier Interactive Patient Education  2017 [employer].

## 2022-04-30 ENCOUNTER — Other Ambulatory Visit: Payer: Self-pay | Admitting: Family Medicine

## 2022-04-30 DIAGNOSIS — I10 Essential (primary) hypertension: Secondary | ICD-10-CM

## 2022-06-02 ENCOUNTER — Telehealth: Payer: Self-pay | Admitting: Family Medicine

## 2022-06-02 NOTE — Telephone Encounter (Signed)
Contacted Liana Gerold Barcelo to schedule their annual wellness visit. Appointment made for 07/08/2022.  Thank you,  Colletta [place],  [facility] Direct Dial ??[phone]

## 2022-06-12 ENCOUNTER — Other Ambulatory Visit: Payer: Self-pay | Admitting: Family Medicine

## 2022-06-12 DIAGNOSIS — I10 Essential (primary) hypertension: Secondary | ICD-10-CM

## 2022-06-23 ENCOUNTER — Other Ambulatory Visit: Payer: Self-pay | Admitting: Family Medicine

## 2022-06-23 DIAGNOSIS — I10 Essential (primary) hypertension: Secondary | ICD-10-CM

## 2022-07-07 NOTE — Progress Notes (Signed)
Subjective:   Gary Harris is a 72 y.o. male who presents for Medicare Annual/Subsequent preventive examination.  I connected with  Alcide Goodness on 07/08/22 by a audio enabled telemedicine application and verified that I am speaking with the correct person using two identifiers.  Patient Location: Home  Provider Location: Office/Clinic  I discussed the limitations of evaluation and management by telemedicine. The patient expressed understanding and agreed to proceed.  Review of Systems     Cardiac Risk Factors include: advanced age (>71men, >29 women);dyslipidemia;hypertension;male gender;sedentary lifestyle     Objective:    Today's Vitals   07/08/22 1341  Weight: 163 lb (73.9 kg)  Height:  (1.753 m)   Body mass index is 24.07 kg/m.     07/08/2022    1:44 PM 03/30/2021   11:03 AM 02/17/2020   10:20 PM  Advanced Directives  Does Patient Have a Medical Advance Directive? Yes Yes Yes  Type of Advance Directive Living will Healthcare Power of [place];Living will Living will;Healthcare Power of Attorney  Does patient want to make changes to medical advance directive? No - Patient declined    Copy of Healthcare Power of Attorney in Chart?  No - copy requested     Current Medications (verified) Outpatient Encounter Medications as of 07/08/2022  Medication Sig   amLODipine (NORVASC) 10 MG tablet TAKE 1 TABLET(10 MG) BY MOUTH DAILY   benazepril (LOTENSIN) 20 MG tablet TAKE 1 TABLET(20 MG) BY MOUTH DAILY   famotidine (PEPCID) 20 MG tablet TAKE 1 TABLET(20 MG) BY MOUTH TWICE DAILY   Fe-Succ Ac-B Cmplx-C-Ca-FA (IROSPAN 24/6) MISC Take 1 tablet by mouth daily.   fexofenadine (ALLEGRA) 60 MG tablet Take by mouth.   morphine (MS CONTIN) 60 MG 12 hr tablet Take 60 mg by mouth 2 (two) times daily.   naloxone (NARCAN) nasal spray 4 mg/0.1 mL One spray in either nostril once for known/suspected opioid overdose. May repeat every 2-3 minutes in alternating nostril til  EMS arrives   omeprazole (PRILOSEC) 20 MG capsule TAKE 1 CAPSULE BY MOUTH TWICE DAILY   oxyCODONE-acetaminophen (PERCOCET) 10-325 MG per tablet Take 1 tablet by mouth every 4 (four) hours as needed for pain.   SALINE NASAL SPRAY NA Place into the nose.   Simethicone (GAS RELIEF PO) Take by mouth.   tamsulosin (FLOMAX) 0.4 MG CAPS capsule TAKE 1 CAPSULE(0.4 MG) BY MOUTH DAILY   FLUoxetine (PROZAC) 20 MG capsule Take 1 capsule by mouth daily. (Patient not taking: Reported on 07/08/2022)   No facility-administered encounter medications on file as of 07/08/2022.    Allergies (verified) Effexor xr [venlafaxine hcl er], Paxil [paroxetine hcl], and Clindamycin/lincomycin   History: Past Medical History:  Diagnosis Date   Arthritis    Bipolar disorder    Chronic pain    Depression    GERD (gastroesophageal reflux disease)    Hypertension    Past Surgical History:  Procedure Laterality Date   COLONOSCOPY     TONSILLECTOMY     Family History  Problem Relation Age of Onset   Heart attack Father    Hypertension Mother    Hypertension Sister    Cancer Other        breast   Social History   Socioeconomic History   Marital status: Married    Spouse name: Darl Pikes   Number of children: Not on file   Years of education: Not on file   Highest education level: Not on file  Occupational History  Not on file  Tobacco Use   Smoking status: Never   Smokeless tobacco: Never  Substance and Sexual Activity   Alcohol use: No   Drug use: No   Sexual activity: Not on file  Other Topics Concern   Not on file  Social History Narrative   Married x 27 years.   Social Determinants of Health   Financial Resource Strain: Low Risk  (07/08/2022)   Overall Financial Resource Strain (CARDIA)    Difficulty of Paying Living Expenses: Not hard at all  Food Insecurity: No Food Insecurity (07/08/2022)   Hunger Vital Sign    Worried About Running Out of Food in the Last Year: Never true    Ran Out of  Food in the Last Year: Never true  Transportation Needs: No Transportation Needs (07/08/2022)   PRAPARE - Administrator, Civil Service (Medical): No    Lack of Transportation (Non-Medical): No  Physical Activity: Inactive (07/08/2022)   Exercise Vital Sign    Days of Exercise per Week: 0 days    Minutes of Exercise per Session: 0 min  Stress: No Stress Concern Present (07/08/2022)   [employer] of Occupational Health - Occupational Stress Questionnaire    Feeling of Stress : Not at all  Social Connections: Moderately Isolated (07/08/2022)   Social Connection and Isolation Panel [NHANES]    Frequency of Communication with Friends and Family: More than three times a week    Frequency of Social Gatherings with Friends and Family: More than three times a week    Attends Religious Services: Never    Database administrator or Organizations: No    Attends Engineer, structural: Never    Marital Status: Married    Tobacco Counseling Counseling given: Not Answered   Clinical Intake:  Pre-visit preparation completed: Yes  Pain : No/denies pain     Diabetes: No  How often do you need to have someone help you when you read instructions, pamphlets, or other written materials from your doctor or pharmacy?: 1 - Never  Diabetic?No   Interpreter Needed?: No  Information entered by :: Kandis Fantasia LPN   Activities of Daily Living    07/08/2022    1:44 PM  In your present state of health, do you have any difficulty performing the following activities:  Hearing? 0  Vision? 0  Difficulty concentrating or making decisions? 0  Walking or climbing stairs? 1  Dressing or bathing? 0  Doing errands, shopping? 1  Preparing Food and eating ? N  Using the Toilet? N  In the past six months, have you accidently leaked urine? N  Do you have problems with loss of bowel control? N  Managing your Medications? N  Managing your Finances? N  Housekeeping or managing your  Housekeeping? Y    Patient Care Team: Tommie Sams, DO as PCP - General (Family Medicine) Specialists, [facility] (Orthopedic Surgery) Shiela Mayer, MD as Referring Physician (Anesthesiology)  Indicate any recent Medical Services you may have received from other than [facility] providers in the past year (date may be approximate).     Assessment:   This is a routine wellness examination for [place].  Hearing/Vision screen Hearing Screening - Comments:: Denies hearing difficulties   Vision Screening - Comments:: Wears rx glasses - up to date with routine eye exams with Dr. Cathey Endow    Dietary issues and exercise activities discussed: Current Exercise Habits: The patient does not participate in regular exercise  at present   Goals Addressed   None    Depression Screen    07/08/2022    1:44 PM 03/30/2021   10:59 AM 03/05/2021   10:39 AM 08/10/2020    2:00 PM 08/09/2017    2:02 PM 01/12/2017    2:35 PM  PHQ 2/9 Scores  PHQ - 2 Score 0 1 2 0 1 0  PHQ- 9 Score  Fall Risk    07/08/2022    1:42 PM 03/30/2021   11:04 AM 08/10/2020    1:59 PM 01/12/2017    2:35 PM  Fall Risk   Falls in the past year? 0 0 0 No  Number falls in past yr: 0 0 0   Injury with Fall? 0 0 0   Risk for fall due to : No Fall Risks Impaired mobility    Follow up Falls prevention discussed;Education provided;Falls evaluation completed Falls prevention discussed Falls evaluation completed     FALL RISK PREVENTION PERTAINING TO THE HOME:  Any stairs in or around the home? No  If so, are there any without handrails? No  Home free of loose throw rugs in walkways, pet beds, electrical cords, etc? Yes  Adequate lighting in your home to reduce risk of falls? Yes   ASSISTIVE DEVICES UTILIZED TO PREVENT FALLS:  Life alert? No  Use of a cane, walker or w/c? No  Grab bars in the bathroom? Yes  Shower chair or bench in shower? No  Elevated toilet seat or a handicapped toilet? Yes    TIMED UP AND GO:  Was the test performed? No . Telephonic visit   Cognitive Function:        07/08/2022    1:44 PM 03/30/2021   11:06 AM  6CIT Screen  What Year? 0 points 0 points  What month? 0 points 0 points  What time? 0 points 0 points  Count back from 20 0 points 0 points  Months in reverse 0 points 0 points  Repeat phrase 0 points 0 points  Total Score 0 points 0 points    Immunizations Immunization History  Administered Date(s) Administered   Fluad Quad(high Dose 65+) 12/17/2018   Influenza, High Dose Seasonal PF 01/23/2018   Influenza,inj,Quad PF,6+ Mos 12/20/2013, 01/12/2017   Influenza-Unspecified 01/19/2012, 12/12/2012, 12/21/2014, 12/31/2015   Zoster, Live 04/29/2013    TDAP status: Due, Education has been provided regarding the importance of this vaccine. Advised may receive this vaccine at local pharmacy or Health Dept. Aware to provide a copy of the vaccination record if obtained from local pharmacy or Health Dept. Verbalized acceptance and understanding.  Flu Vaccine status: Up to date  Pneumococcal vaccine status: Due, Education has been provided regarding the importance of this vaccine. Advised may receive this vaccine at local pharmacy or Health Dept. Aware to provide a copy of the vaccination record if obtained from local pharmacy or Health Dept. Verbalized acceptance and understanding.  Covid-19 vaccine status: Information provided on how to obtain vaccines.   Qualifies for Shingles Vaccine? Yes   Zostavax completed Yes   Shingrix Completed?: No.    Education has been provided regarding the importance of this vaccine. Patient has been advised to call insurance company to determine out of pocket expense if they have not yet received this vaccine. Advised may also receive vaccine at local pharmacy or Health Dept. Verbalized acceptance and understanding.  Screening Tests Health Maintenance  Topic Date Due   COVID-19 Vaccine (1)  Never done    Hepatitis C Screening  Never done   DTaP/Tdap/Td (1 - Tdap) Never done   Zoster Vaccines- Shingrix (1 of 2) Never done   COLONOSCOPY (Pts 45-51yrs Insurance coverage will need to be confirmed)  Never done   Pneumonia Vaccine 78+ Years old (1 of 1 - PCV) Never done   INFLUENZA VACCINE  10/20/2022   Medicare Annual Wellness (AWV)  07/08/2023   HPV VACCINES  Aged Out    Health Maintenance  Health Maintenance Due  Topic Date Due   COVID-19 Vaccine (1) Never done   Hepatitis C Screening  Never done   DTaP/Tdap/Td (1 - Tdap) Never done   Zoster Vaccines- Shingrix (1 of 2) Never done   COLONOSCOPY (Pts 45-86yrs Insurance coverage will need to be confirmed)  Never done   Pneumonia Vaccine 68+ Years old (1 of 1 - PCV) Never done    Colorectal cancer screening:  Patient has Cologuard kit at home plans to complete soon   Lung Cancer Screening: (Low Dose CT Chest recommended if Age 20-80 years, 30 pack-year currently smoking OR have quit w/in 15years.) does not qualify.   Lung Cancer Screening Referral: n/a  Additional Screening:  Hepatitis C Screening: does qualify;   Vision Screening: Recommended annual ophthalmology exams for early detection of glaucoma and other disorders of the eye. Is the patient up to date with their annual eye exam?  Yes  Who is the provider or what is the name of the office in which the patient attends annual eye exams? Dr. Cathey Endow If pt is not established with a provider, would they like to be referred to a provider to establish care? No .   Dental Screening: Recommended annual dental exams for proper oral hygiene  Community Resource Referral / Chronic Care Management: CRR required this visit?  No   CCM required this visit?  No      Plan:     I have personally reviewed and noted the following in the patient's chart:   Medical and social history Use of alcohol, tobacco or illicit drugs  Current medications and supplements including opioid  prescriptions. Patient is currently taking opioid prescriptions. Information provided to patient regarding non-opioid alternatives. Patient advised to discuss non-opioid treatment plan with their provider. Functional ability and status Nutritional status Physical activity Advanced directives List of other physicians Hospitalizations, surgeries, and ER visits in previous 12 months Vitals Screenings to include cognitive, depression, and falls Referrals and appointments  In addition, I have reviewed and discussed with patient certain preventive protocols, quality metrics, and best practice recommendations. A written personalized care plan for preventive services as well as general preventive health recommendations were provided to patient.     Kandis Fantasia [place], [place]   1/61/0960   Nurse Notes: No concerns

## 2022-07-07 NOTE — Patient Instructions (Signed)
[name] , Thank you for taking time to come for your Medicare Wellness Visit. I appreciate your ongoing commitment to your health goals. Please review the following plan we discussed and let me know if I can assist you in the future.   These are the goals we discussed:  Goals      Exercise 3x per week (30 min per time)     Would like to increase exercise.        This is a list of the screening recommended for you and due dates:  Health Maintenance  Topic Date Due   COVID-19 Vaccine (1) Never done   Hepatitis C Screening: USPSTF Recommendation to screen - Ages 57-79 yo.  Never done   DTaP/Tdap/Td vaccine (1 - Tdap) Never done   Zoster (Shingles) Vaccine (1 of 2) Never done   Colon Cancer Screening  Never done   Pneumonia Vaccine (1 of 1 - PCV) Never done   Medicare Annual Wellness Visit  03/30/2022   Flu Shot  10/20/2022   HPV Vaccine  Aged Out    Advanced directives: Please bring a copy of your health care power of attorney and living will to the office to be added to your chart at your convenience.   Conditions/risks identified: Aim for 30 minutes of exercise or brisk walking, 6-8 glasses of water, and 5 servings of fruits and vegetables each day.   Next appointment: Follow up in one year for your annual wellness visit.   Please have your labs done before your appointment with Gary Harris.  The orders have been sent over to the lab.   Preventive Care 58 Years and Older, Male  Preventive care refers to lifestyle choices and visits with your health care provider that can promote health and wellness. What does preventive care include? A yearly physical exam. This is also called an annual well check. Dental exams once or twice a year. Routine eye exams. Ask your health care provider how often you should have your eyes checked. Personal lifestyle choices, including: Daily care of your teeth and gums. Regular physical activity. Eating a healthy diet. Avoiding tobacco and  drug use. Limiting alcohol use. Practicing safe sex. Taking low doses of aspirin every day. Taking vitamin and mineral supplements as recommended by your health care provider. What happens during an annual well check? The services and screenings done by your health care provider during your annual well check will depend on your age, overall health, lifestyle risk factors, and family history of disease. Counseling  Your health care provider may ask you questions about your: Alcohol use. Tobacco use. Drug use. Emotional well-being. Home and relationship well-being. Sexual activity. Eating habits. History of falls. Memory and ability to understand (cognition). Work and work Astronomer. Screening  You may have the following tests or measurements: Height, weight, and BMI. Blood pressure. Lipid and cholesterol levels. These may be checked every 5 years, or more frequently if you are over 57 years old. Skin check. Lung cancer screening. You may have this screening every year starting at age 71 if you have a 30-pack-year history of smoking and currently smoke or have quit within the past 15 years. Fecal occult blood test (FOBT) of the stool. You may have this test every year starting at age 59. Flexible sigmoidoscopy or colonoscopy. You may have a sigmoidoscopy every 5 years or a colonoscopy every 10 years starting at age 74. Prostate cancer screening. Recommendations will vary depending on your family history and other  risks. Hepatitis C blood test. Hepatitis B blood test. Sexually transmitted disease (STD) testing. Diabetes screening. This is done by checking your blood sugar (glucose) after you have not eaten for a while (fasting). You may have this done every 1-3 years. Abdominal aortic aneurysm (AAA) screening. You may need this if you are a current or former smoker. Osteoporosis. You may be screened starting at age 94 if you are at high risk. Talk with your health care provider  about your test results, treatment options, and if necessary, the need for more tests. Vaccines  Your health care provider may recommend certain vaccines, such as: Influenza vaccine. This is recommended every year. Tetanus, diphtheria, and acellular pertussis (Tdap, Td) vaccine. You may need a Td booster every 10 years. Zoster vaccine. You may need this after age 22. Pneumococcal 13-valent conjugate (PCV13) vaccine. One dose is recommended after age 38. Pneumococcal polysaccharide (PPSV23) vaccine. One dose is recommended after age 29. Talk to your health care provider about which screenings and vaccines you need and how often you need them. This information is not intended to replace advice given to you by your health care provider. Make sure you discuss any questions you have with your health care provider. Document Released: 04/03/2015 Document Revised: 11/25/2015 Document Reviewed: 01/06/2015 Elsevier Interactive Patient Education  2017 [place] Prevention in the Home Falls can cause injuries. They can happen to people of all ages. There are many things you can do to make your home safe and to help prevent falls. What can I do on the outside of my home? Regularly fix the edges of walkways and driveways and fix any cracks. Remove anything that might make you trip as you walk through a door, such as a raised step or threshold. Trim any bushes or trees on the path to your home. Use bright outdoor lighting. Clear any walking paths of anything that might make someone trip, such as rocks or tools. Regularly check to see if handrails are loose or broken. Make sure that both sides of any steps have handrails. Any raised decks and porches should have guardrails on the edges. Have any leaves, snow, or ice cleared regularly. Use sand or salt on walking paths during winter. Clean up any spills in your garage right away. This includes oil or grease spills. What can I do in the  bathroom? Use night lights. Install grab bars by the toilet and in the tub and shower. Do not use towel bars as grab bars. Use non-skid mats or decals in the tub or shower. If you need to sit down in the shower, use a plastic, non-slip stool. Keep the floor dry. Clean up any water that spills on the floor as soon as it happens. Remove soap buildup in the tub or shower regularly. Attach bath mats securely with double-sided non-slip rug tape. Do not have throw rugs and other things on the floor that can make you trip. What can I do in the bedroom? Use night lights. Make sure that you have a light by your bed that is easy to reach. Do not use any sheets or blankets that are too big for your bed. They should not hang down onto the floor. Have a firm chair that has side arms. You can use this for support while you get dressed. Do not have throw rugs and other things on the floor that can make you trip. What can I do in the kitchen? Clean up any spills right away.  Avoid walking on wet floors. Keep items that you use a lot in easy-to-reach places. If you need to reach something above you, use a strong step stool that has a grab bar. Keep electrical cords out of the way. Do not use floor polish or wax that makes floors slippery. If you must use wax, use non-skid floor wax. Do not have throw rugs and other things on the floor that can make you trip. What can I do with my stairs? Do not leave any items on the stairs. Make sure that there are handrails on both sides of the stairs and use them. Fix handrails that are broken or loose. Make sure that handrails are as long as the stairways. Check any carpeting to make sure that it is firmly attached to the stairs. Fix any carpet that is loose or worn. Avoid having throw rugs at the top or bottom of the stairs. If you do have throw rugs, attach them to the floor with carpet tape. Make sure that you have a light switch at the top of the stairs and the  bottom of the stairs. If you do not have them, ask someone to add them for you. What else can I do to help prevent falls? Wear shoes that: Do not have high heels. Have rubber bottoms. Are comfortable and fit you well. Are closed at the toe. Do not wear sandals. If you use a stepladder: Make sure that it is fully opened. Do not climb a closed stepladder. Make sure that both sides of the stepladder are locked into place. Ask someone to hold it for you, if possible. Clearly mark and make sure that you can see: Any grab bars or handrails. First and last steps. Where the edge of each step is. Use tools that help you move around (mobility aids) if they are needed. These include: Canes. Walkers. Scooters. Crutches. Turn on the lights when you go into a dark area. Replace any light bulbs as soon as they burn out. Set up your furniture so you have a clear path. Avoid moving your furniture around. If any of your floors are uneven, fix them. If there are any pets around you, be aware of where they are. Review your medicines with your doctor. Some medicines can make you feel dizzy. This can increase your chance of falling. Ask your doctor what other things that you can do to help prevent falls. This information is not intended to replace advice given to you by your health care provider. Make sure you discuss any questions you have with your health care provider. Document Released: 01/01/2009 Document Revised: 08/13/2015 Document Reviewed: 04/11/2014 Elsevier Interactive Patient Education  2017 [employer].

## 2022-07-08 ENCOUNTER — Ambulatory Visit (INDEPENDENT_AMBULATORY_CARE_PROVIDER_SITE_OTHER): Payer: Medicare Other

## 2022-07-08 VITALS — Ht 69.0 in | Wt 163.0 lb

## 2022-07-08 DIAGNOSIS — Z0000 Encounter for general adult medical examination without abnormal findings: Secondary | ICD-10-CM

## 2022-07-15 ENCOUNTER — Other Ambulatory Visit: Payer: Self-pay | Admitting: Family Medicine

## 2022-07-15 DIAGNOSIS — I10 Essential (primary) hypertension: Secondary | ICD-10-CM

## 2022-07-18 ENCOUNTER — Telehealth: Payer: Self-pay | Admitting: Family Medicine

## 2022-07-18 ENCOUNTER — Other Ambulatory Visit: Payer: Self-pay | Admitting: Family Medicine

## 2022-07-18 DIAGNOSIS — I10 Essential (primary) hypertension: Secondary | ICD-10-CM

## 2022-07-18 MED ORDER — AMLODIPINE BESYLATE 10 MG PO TABS
ORAL_TABLET | ORAL | 0 refills | Status: DC
Start: 2022-07-18 — End: 2022-08-01

## 2022-07-18 MED ORDER — BENAZEPRIL HCL 20 MG PO TABS
ORAL_TABLET | ORAL | 0 refills | Status: DC
Start: 2022-07-18 — End: 2022-08-01

## 2022-07-18 MED ORDER — TAMSULOSIN HCL 0.4 MG PO CAPS: ORAL_CAPSULE | ORAL | 0 refills | Status: DC

## 2022-07-18 NOTE — Telephone Encounter (Signed)
Refill on  amLODipine (NORVASC) 10 MG tablet,  benazepril (LOTENSIN) 20 MG tablet   tamsulosin (FLOMAX) 0.4 MG CAPS capsule ,has appointment  5/13  just enough until appointment completely out. [facility] [address]

## 2022-07-18 NOTE — Telephone Encounter (Signed)
Received via fax Rx request: Prescription sent electronically to pharmacy  

## 2022-07-21 NOTE — Telephone Encounter (Signed)
Patient has appointment on 5/13 for medication followup

## 2022-08-01 ENCOUNTER — Encounter: Payer: Self-pay | Admitting: Family Medicine

## 2022-08-01 ENCOUNTER — Ambulatory Visit (INDEPENDENT_AMBULATORY_CARE_PROVIDER_SITE_OTHER): Payer: Medicare Other | Admitting: Family Medicine

## 2022-08-01 VITALS — BP 115/67 | HR 110 | Temp 98.5°F | Ht 69.0 in | Wt 162.0 lb

## 2022-08-01 DIAGNOSIS — I10 Essential (primary) hypertension: Secondary | ICD-10-CM

## 2022-08-01 DIAGNOSIS — Z0000 Encounter for general adult medical examination without abnormal findings: Secondary | ICD-10-CM

## 2022-08-01 DIAGNOSIS — Z0001 Encounter for general adult medical examination with abnormal findings: Secondary | ICD-10-CM

## 2022-08-01 DIAGNOSIS — E291 Testicular hypofunction: Secondary | ICD-10-CM | POA: Diagnosis not present

## 2022-08-01 DIAGNOSIS — Z23 Encounter for immunization: Secondary | ICD-10-CM | POA: Diagnosis not present

## 2022-08-01 DIAGNOSIS — Z1159 Encounter for screening for other viral diseases: Secondary | ICD-10-CM

## 2022-08-01 MED ORDER — BENAZEPRIL HCL 20 MG PO TABS: ORAL_TABLET | ORAL | 1 refills | Status: DC

## 2022-08-01 MED ORDER — FAMOTIDINE 20 MG PO TABS: ORAL_TABLET | ORAL | 1 refills | Status: DC

## 2022-08-01 MED ORDER — OMEPRAZOLE 20 MG PO CPDR: 20.0000 mg | DELAYED_RELEASE_CAPSULE | Freq: Two times a day (BID) | ORAL | 1 refills | Status: DC

## 2022-08-01 MED ORDER — AMLODIPINE BESYLATE 10 MG PO TABS: ORAL_TABLET | ORAL | 1 refills | Status: DC

## 2022-08-01 NOTE — Patient Instructions (Signed)
Labs ordered.  Continue your medication.  Consider Tdap and Shingles vaccine at the pharmacy.  Please send in the cologuard.  Follow up in 6 months.

## 2022-08-02 DIAGNOSIS — Z0000 Encounter for general adult medical examination without abnormal findings: Secondary | ICD-10-CM | POA: Insufficient documentation

## 2022-08-02 NOTE — Assessment & Plan Note (Signed)
Labs including hep C screening today. Advised to complete his Cologuard. Pneumococcal vaccine given today. Recommended shingles vaccine and tetanus vaccine at his pharmacy.

## 2022-08-02 NOTE — Progress Notes (Signed)
Subjective:  Patient ID: Gary Harris, male    DOB: 31-Aug-1950  Age: 72 y.o. MRN: [id number]  CC: Chief Complaint  Patient presents with   Annual Exam    HPI:  72 year old male presents for an annual exam.  Patient states that overall he is doing well.  He is overdue for several preventative healthcare items.  Patient states that he has a Cologuard at home but has not done it.  I have advised him that he needs to do this or if it has expired I am happy to send him a new one.  Patient is a candidate for pneumococcal vaccine.  He would like this today.  We discussed the need for shingles vaccine and tetanus vaccine.  He is advised that he can get this at his pharmacy.  Patient needs refills on his medications.  Also needs labs.  Patient Active Problem List   Diagnosis Date Noted   Annual physical exam 08/02/2022   BPH (benign prostatic hyperplasia) 03/05/2021   Murmur 03/05/2021   Hyperlipidemia 03/05/2021   Depression 05/03/2015   Chronic pain syndrome 02/22/2013   Essential hypertension, benign 02/22/2013   Esophageal reflux 02/22/2013    Social Hx   Social History   Socioeconomic History   Marital status: Married    Spouse name: Darl Pikes   Number of children: Not on file   Years of education: Not on file   Highest education level: Not on file  Occupational History   Not on file  Tobacco Use   Smoking status: Never   Smokeless tobacco: Never  Substance and Sexual Activity   Alcohol use: No   Drug use: No   Sexual activity: Not on file  Other Topics Concern   Not on file  Social History Narrative   Married x 27 years.   Social Determinants of Health   Financial Resource Strain: Low Risk  (07/08/2022)   Overall Financial Resource Strain (CARDIA)    Difficulty of Paying Living Expenses: Not hard at all  Food Insecurity: No Food Insecurity (07/08/2022)   Hunger Vital Sign    Worried About Running Out of Food in the Last Year: Never true    Ran Out of Food in  the Last Year: Never true  Transportation Needs: No Transportation Needs (07/08/2022)   PRAPARE - Administrator, Civil Service (Medical): No    Lack of Transportation (Non-Medical): No  Physical Activity: Inactive (07/08/2022)   Exercise Vital Sign    Days of Exercise per Week: 0 days    Minutes of Exercise per Session: 0 min  Stress: No Stress Concern Present (07/08/2022)   [employer] of Occupational Health - Occupational Stress Questionnaire    Feeling of Stress : Not at all  Social Connections: Moderately Isolated (07/08/2022)   Social Connection and Isolation Panel [NHANES]    Frequency of Communication with Friends and Family: More than three times a week    Frequency of Social Gatherings with Friends and Family: More than three times a week    Attends Religious Services: Never    Database administrator or Organizations: No    Attends Banker Meetings: Never    Marital Status: Married    Review of Systems  Constitutional: Negative.   Musculoskeletal:  Positive for arthralgias.   Objective:  BP 115/67   Pulse (!) 110   Temp 98.5 F (36.9 C)   Ht 5\' 9"  (1.753 m)   Wt 162 lb (73.5 kg)  SpO2 99%   BMI 23.92 kg/m      08/01/2022    1:37 PM 07/08/2022    1:41 PM 03/30/2021   10:55 AM  BP/Weight  Systolic BP 115    Diastolic BP 67    Wt. (Lbs) 162 163 163  BMI 23.92 kg/m2 24.07 kg/m2 24.07 kg/m2    Physical Exam Vitals and nursing note reviewed.  Constitutional:      General: He is not in acute distress.    Appearance: Normal appearance.  HENT:     Head: Normocephalic and atraumatic.  Eyes:     General:        Right eye: No discharge.        Left eye: No discharge.     Conjunctiva/sclera: Conjunctivae normal.  Cardiovascular:     Rate and Rhythm: Normal rate and regular rhythm.     Heart sounds: Murmur heard.  Pulmonary:     Effort: Pulmonary effort is normal.     Breath sounds: Normal breath sounds. No wheezing, rhonchi or  rales.  Neurological:     Mental Status: He is alert.  Psychiatric:        Mood and Affect: Mood normal.        Behavior: Behavior normal.     Lab Results  Component Value Date   WBC 9.6 04/28/2015   HGB 12.1 (L) 04/28/2015   HCT 35.5 (L) 04/28/2015   PLT 285 04/28/2015   GLUCOSE 144 (H) 08/04/2020   CHOL 186 11/10/2017   TRIG 170 (H) 11/10/2017   HDL 37 (L) 11/10/2017   LDLCALC 115 (H) 11/10/2017   ALT 6 11/10/2017   AST 12 11/10/2017   NA 141 08/04/2020   K 4.2 08/04/2020   CL 104 08/04/2020   CREATININE 1.20 08/04/2020   BUN 13 08/04/2020   CO2 22 08/04/2020   TSH 2.289 12/20/2009   PSA 0.82 12/23/2013     Assessment & Plan:   Problem List Items Addressed This Visit       Cardiovascular and Mediastinum   Essential hypertension, benign   Relevant Medications   amLODipine (NORVASC) 10 MG tablet   benazepril (LOTENSIN) 20 MG tablet     Other   Annual physical exam - Primary    Labs including hep C screening today. Advised to complete his Cologuard. Pneumococcal vaccine given today. Recommended shingles vaccine and tetanus vaccine at his pharmacy.      Other Visit Diagnoses     Encounter for hepatitis C screening test for low risk patient       Relevant Orders   Hepatitis C Antibody   Hypogonadism in male       Relevant Orders   Testosterone   Immunization due       Relevant Orders   Pneumococcal conjugate vaccine 20-valent (Prevnar 20) (Completed)       Meds ordered this encounter  Medications   amLODipine (NORVASC) 10 MG tablet    Sig: TAKE 1 TABLET(10 MG) BY MOUTH DAILY    Dispense:  90 tablet    Refill:  1   benazepril (LOTENSIN) 20 MG tablet    Sig: TAKE 1 TABLET(20 MG) BY MOUTH DAILY    Dispense:  90 tablet    Refill:  1   famotidine (PEPCID) 20 MG tablet    Sig: TAKE 1 TABLET(20 MG) BY MOUTH TWICE DAILY    Dispense:  180 tablet    Refill:  1   omeprazole (PRILOSEC) 20 MG capsule  Sig: Take 1 capsule (20 mg total) by mouth 2  (two) times daily.    Dispense:  180 capsule    Refill:  1    Follow-up:  Return in about 6 months (around 02/01/2023).  Everlene Other DO [facility]

## 2022-09-09 ENCOUNTER — Emergency Department (HOSPITAL_COMMUNITY): Payer: Medicare Other | Admitting: Certified Registered Nurse Anesthetist

## 2022-09-09 ENCOUNTER — Ambulatory Visit (HOSPITAL_COMMUNITY)
Admission: EM | Admit: 2022-09-09 | Discharge: 2022-09-09 | Disposition: A | Discharge status: Home / Self Care | Payer: Medicare Other | Attending: Emergency Medicine | Admitting: Emergency Medicine

## 2022-09-09 ENCOUNTER — Encounter (HOSPITAL_COMMUNITY)
Admission: EM | Disposition: A | Discharge status: Home / Self Care | Payer: Self-pay | Source: Home / Self Care | Attending: Emergency Medicine

## 2022-09-09 ENCOUNTER — Telehealth: Payer: Self-pay | Admitting: Gastroenterology

## 2022-09-09 ENCOUNTER — Emergency Department (HOSPITAL_BASED_OUTPATIENT_CLINIC_OR_DEPARTMENT_OTHER): Payer: Medicare Other | Admitting: Certified Registered Nurse Anesthetist

## 2022-09-09 ENCOUNTER — Encounter (HOSPITAL_COMMUNITY): Payer: Self-pay | Admitting: Emergency Medicine

## 2022-09-09 ENCOUNTER — Other Ambulatory Visit: Payer: Self-pay

## 2022-09-09 DIAGNOSIS — I10 Essential (primary) hypertension: Secondary | ICD-10-CM | POA: Diagnosis not present

## 2022-09-09 DIAGNOSIS — K219 Gastro-esophageal reflux disease without esophagitis: Secondary | ICD-10-CM | POA: Diagnosis not present

## 2022-09-09 DIAGNOSIS — F319 Bipolar disorder, unspecified: Secondary | ICD-10-CM | POA: Insufficient documentation

## 2022-09-09 DIAGNOSIS — E876 Hypokalemia: Secondary | ICD-10-CM | POA: Diagnosis not present

## 2022-09-09 DIAGNOSIS — T18128A Food in esophagus causing other injury, initial encounter: Secondary | ICD-10-CM | POA: Insufficient documentation

## 2022-09-09 DIAGNOSIS — G8929 Other chronic pain: Secondary | ICD-10-CM | POA: Diagnosis not present

## 2022-09-09 DIAGNOSIS — W44F3XA Food entering into or through a natural orifice, initial encounter: Secondary | ICD-10-CM | POA: Diagnosis not present

## 2022-09-09 DIAGNOSIS — K3189 Other diseases of stomach and duodenum: Secondary | ICD-10-CM | POA: Diagnosis not present

## 2022-09-09 DIAGNOSIS — K222 Esophageal obstruction: Secondary | ICD-10-CM | POA: Insufficient documentation

## 2022-09-09 DIAGNOSIS — Z79899 Other long term (current) drug therapy: Secondary | ICD-10-CM | POA: Diagnosis not present

## 2022-09-09 DIAGNOSIS — T18108A Unspecified foreign body in esophagus causing other injury, initial encounter: Secondary | ICD-10-CM | POA: Diagnosis not present

## 2022-09-09 HISTORY — PX: BIOPSY: SHX5522

## 2022-09-09 HISTORY — PX: ESOPHAGOGASTRODUODENOSCOPY (EGD) WITH PROPOFOL: SHX5813

## 2022-09-09 HISTORY — PX: ESOPHAGEAL DILATION: SHX303

## 2022-09-09 LAB — CBC WITH DIFFERENTIAL/PLATELET
Abs Immature Granulocytes: 0.02 10*3/uL (ref 0.00–0.07)
Basophils Absolute: 0.1 10*3/uL (ref 0.0–0.1)
Basophils Relative: 1 %
Eosinophils Absolute: 0.1 10*3/uL (ref 0.0–0.5)
Eosinophils Relative: 1 %
HCT: 35.6 % — ABNORMAL LOW (ref 39.0–52.0)
Hemoglobin: 12.2 g/dL — ABNORMAL LOW (ref 13.0–17.0)
Immature Granulocytes: 0 %
Lymphocytes Relative: 11 %
Lymphs Abs: 1 10*3/uL (ref 0.7–4.0)
MCH: 31 pg (ref 26.0–34.0)
MCHC: 34.3 g/dL (ref 30.0–36.0)
MCV: 90.6 fL (ref 80.0–100.0)
Monocytes Absolute: 0.4 10*3/uL (ref 0.1–1.0)
Monocytes Relative: 4 %
Neutro Abs: 7.3 10*3/uL (ref 1.7–7.7)
Neutrophils Relative %: 83 %
Platelets: 220 10*3/uL (ref 150–400)
RBC: 3.93 MIL/uL — ABNORMAL LOW (ref 4.22–5.81)
RDW: 12.5 % (ref 11.5–15.5)
WBC: 8.8 10*3/uL (ref 4.0–10.5)
nRBC: 0 % (ref 0.0–0.2)

## 2022-09-09 LAB — COMPREHENSIVE METABOLIC PANEL
ALT: 12 U/L (ref 0–44)
AST: 15 U/L (ref 15–41)
Albumin: 4.8 g/dL (ref 3.5–5.0)
Alkaline Phosphatase: 52 U/L (ref 38–126)
Anion gap: 10 (ref 5–15)
BUN: 15 mg/dL (ref 8–23)
CO2: 25 mmol/L (ref 22–32)
Calcium: 9.7 mg/dL (ref 8.9–10.3)
Chloride: 109 mmol/L (ref 98–111)
Creatinine, Ser: 1.01 mg/dL (ref 0.61–1.24)
GFR, Estimated: >60 mL/min (ref >60)
Glucose, Bld: 125 mg/dL — ABNORMAL HIGH (ref 70–99)
Potassium: 3.3 mmol/L — ABNORMAL LOW (ref 3.5–5.1)
Sodium: 144 mmol/L (ref 135–145)
Total Bilirubin: 1.6 mg/dL — ABNORMAL HIGH (ref 0.3–1.2)
Total Protein: 8.3 g/dL — ABNORMAL HIGH (ref 6.5–8.1)

## 2022-09-09 LAB — RAPID URINE DRUG SCREEN, HOSP PERFORMED
Amphetamines: NOT DETECTED
Barbiturates: NOT DETECTED
Benzodiazepines: NOT DETECTED
Cocaine: NOT DETECTED
Opiates: POSITIVE — AB
Tetrahydrocannabinol: NOT DETECTED

## 2022-09-09 SURGERY — ESOPHAGOGASTRODUODENOSCOPY (EGD) WITH PROPOFOL
Anesthesia: General

## 2022-09-09 MED ORDER — PANTOPRAZOLE SODIUM 40 MG IV SOLR
40.0000 mg | Freq: Once | INTRAVENOUS | Status: AC
  Administered 2022-09-09: 40 mg via INTRAVENOUS
  Filled 2022-09-09: qty 10

## 2022-09-09 MED ORDER — SUCCINYLCHOLINE CHLORIDE 200 MG/10ML IV SOSY
PREFILLED_SYRINGE | INTRAVENOUS | Status: AC
  Filled 2022-09-09: qty 10

## 2022-09-09 MED ORDER — DEXAMETHASONE SODIUM PHOSPHATE 10 MG/ML IJ SOLN
INTRAMUSCULAR | Status: DC | PRN
  Administered 2022-09-09: 10 mg via INTRAVENOUS

## 2022-09-09 MED ORDER — POTASSIUM CHLORIDE 10 MEQ/100ML IV SOLN
10.0000 meq | INTRAVENOUS | Status: AC
  Administered 2022-09-09 (×3): 10 meq via INTRAVENOUS
  Filled 2022-09-09: qty 100

## 2022-09-09 MED ORDER — GLUCAGON HCL RDNA (DIAGNOSTIC) 1 MG IJ SOLR
1.0000 mg | Freq: Once | INTRAMUSCULAR | Status: AC
  Administered 2022-09-09: 1 mg via INTRAVENOUS
  Filled 2022-09-09: qty 1

## 2022-09-09 MED ORDER — SUCCINYLCHOLINE CHLORIDE 200 MG/10ML IV SOSY
PREFILLED_SYRINGE | INTRAVENOUS | Status: DC | PRN
  Administered 2022-09-09: 100 mg via INTRAVENOUS

## 2022-09-09 MED ORDER — LACTATED RINGERS IV SOLN: INTRAVENOUS | Status: DC

## 2022-09-09 MED ORDER — LIDOCAINE HCL (PF) 2 % IJ SOLN
INTRAMUSCULAR | Status: AC
  Filled 2022-09-09: qty 5

## 2022-09-09 MED ORDER — SODIUM CHLORIDE 0.9 % IV SOLN: INTRAVENOUS | Status: DC

## 2022-09-09 MED ORDER — DEXAMETHASONE SODIUM PHOSPHATE 4 MG/ML IJ SOLN
INTRAMUSCULAR | Status: AC
  Filled 2022-09-09: qty 1

## 2022-09-09 MED ORDER — OMEPRAZOLE 40 MG PO CPDR: 40.0000 mg | DELAYED_RELEASE_CAPSULE | Freq: Two times a day (BID) | ORAL | 1 refills | Status: AC

## 2022-09-09 MED ORDER — SODIUM CHLORIDE 0.9 % IV BOLUS
1000.0000 mL | Freq: Once | INTRAVENOUS | Status: AC
  Administered 2022-09-09: 1000 mL via INTRAVENOUS

## 2022-09-09 MED ORDER — LIDOCAINE 2% (20 MG/ML) 5 ML SYRINGE
INTRAMUSCULAR | Status: DC | PRN
  Administered 2022-09-09: 100 mg via INTRAVENOUS

## 2022-09-09 MED ORDER — PROPOFOL 10 MG/ML IV BOLUS
INTRAVENOUS | Status: DC | PRN
  Administered 2022-09-09: 150 mg via INTRAVENOUS

## 2022-09-09 MED ORDER — ONDANSETRON HCL 4 MG/2ML IJ SOLN
4.0000 mg | Freq: Once | INTRAMUSCULAR | Status: AC
  Administered 2022-09-09: 4 mg via INTRAVENOUS
  Filled 2022-09-09: qty 2

## 2022-09-09 MED ORDER — PROPOFOL 10 MG/ML IV BOLUS
INTRAVENOUS | Status: AC
  Filled 2022-09-09: qty 20

## 2022-09-09 NOTE — Anesthesia Preprocedure Evaluation (Signed)
Anesthesia Evaluation  Patient identified by MRN, date of birth, ID band Patient awake    Reviewed: Allergy & Precautions, H&P , NPO status , Patient's Chart, lab work & pertinent test results  Airway Mallampati: II  TM Distance: >3 FB Neck ROM: Full    Dental no notable dental hx.    Pulmonary neg pulmonary ROS   Pulmonary exam normal breath sounds clear to auscultation       Cardiovascular hypertension, Pt. on medications Normal cardiovascular exam Rhythm:Regular Rate:Normal     Neuro/Psych  PSYCHIATRIC DISORDERS  Depression Bipolar Disorder   negative neurological ROS     GI/Hepatic Neg liver ROS,GERD  Medicated and Poorly Controlled,,  Endo/Other  negative endocrine ROS    Renal/GU negative Renal ROS  negative genitourinary   Musculoskeletal  (+) Arthritis , Osteoarthritis,    Abdominal   Peds negative pediatric ROS (+)  Hematology negative hematology ROS (+)   Anesthesia Other Findings   Reproductive/Obstetrics negative OB ROS                             Anesthesia Physical Anesthesia Plan  ASA: 2 and emergent  Anesthesia Plan: General   Post-op Pain Management: Minimal or no pain anticipated   Induction: Intravenous, Rapid sequence and Cricoid pressure planned  PONV Risk Score and Plan: 2 and Dexamethasone  Airway Management Planned: Oral ETT  Additional Equipment:   Intra-op Plan:   Post-operative Plan: Extubation in OR  Informed Consent: I have reviewed the patients History and Physical, chart, labs and discussed the procedure including the risks, benefits and alternatives for the proposed anesthesia with the patient or authorized representative who has indicated his/her understanding and acceptance.     Dental advisory given  Plan Discussed with: CRNA and Surgeon  Anesthesia Plan Comments:        Anesthesia Quick Evaluation

## 2022-09-09 NOTE — Anesthesia Procedure Notes (Signed)
Procedure Name: Intubation Date/Time: 09/09/2022 9:55 AM  Performed by: Cy Blamer, CRNAPre-anesthesia Checklist: Patient identified, Emergency Drugs available, Suction available and Patient being monitored Patient Re-evaluated:Patient Re-evaluated prior to induction Oxygen Delivery Method: Circle system utilized Preoxygenation: Pre-oxygenation with 100% oxygen Induction Type: IV induction, Rapid sequence and Cricoid Pressure applied Laryngoscope Size: Miller and 2 Grade View: Grade I Tube type: Oral Tube size: 7.0 mm Number of attempts: 1 Airway Equipment and Method: Stylet and Bite block Placement Confirmation: ETT inserted through vocal cords under direct vision, positive ETCO2 and breath sounds checked- equal and bilateral Secured at: 23 cm Tube secured with: Tape Dental Injury: Teeth and Oropharynx as per pre-operative assessment  Comments: Full stomach RSI

## 2022-09-09 NOTE — ED Provider Notes (Signed)
EMERGENCY DEPARTMENT AT [facility] Provider Note   CSN: [id number] Arrival date & time: 09/09/22  0330     History  Chief Complaint  Patient presents with   Pill stuck in throat    Gary Harris is a 72 y.o. male.  HPI     This is a 72 year old male who presents with concern for food impaction.  Patient reports that yesterday he took a large pill of Tylenol midday.  He states that he felt like it got stuck.  He was slowly able to drink and felt like it dislodged and went into his stomach.  He went to a doctor's appointment.  On his way home he got hot dog.  He states that he took 2 bites of a hotdog and vomited it back up.  Since that time he has been unable to tolerate solid food or liquid.  He reports that he has tried to drink sodas with no relief.  He states he had 1 episode several years ago that self resolved and he never had an EGD.  Previously saw Dr. Matthias Hughs; however, he has retired.  Of note, patient states he feels dehydrated.  He feels like his legs are spasming.  Home Medications Prior to Admission medications   Medication Sig Start Date End Date Taking? Authorizing Provider  amLODipine (NORVASC) 10 MG tablet TAKE 1 TABLET(10 MG) BY MOUTH DAILY 08/01/22   Cook, [place] G, DO  benazepril (LOTENSIN) 20 MG tablet TAKE 1 TABLET(20 MG) BY MOUTH DAILY 08/01/22   Cook, [place] G, DO  famotidine (PEPCID) 20 MG tablet TAKE 1 TABLET(20 MG) BY MOUTH TWICE DAILY 08/01/22   Cook, Dorie Rank G, DO  Fe-Succ Ac-B [id number] [facility] 24/6) MISC Take 1 tablet by mouth daily.    [provider]  fexofenadine (ALLEGRA) 60 MG tablet Take by mouth. 06/05/09   [provider]  FLUoxetine (PROZAC) 20 MG capsule Take 1 capsule by mouth daily. Patient not taking: Reported on 07/08/2022 10/01/15   [provider]  morphine (MS CONTIN) 60 MG 12 hr tablet Take 60 mg by mouth 2 (two) times daily.    [provider]  naloxone [facility]) nasal spray 4  mg/0.1 mL One spray in either nostril once for known/suspected opioid overdose. May repeat every 2-3 minutes in alternating nostril til EMS arrives 01/24/19   [provider]  omeprazole (PRILOSEC) 20 MG capsule Take 1 capsule (20 mg total) by mouth 2 (two) times daily. 08/01/22   Tommie Sams, DO  OVER THE COUNTER MEDICATION multivitamins    [provider]  oxyCODONE-acetaminophen (PERCOCET) 10-325 MG per tablet Take 1 tablet by mouth every 4 (four) hours as needed for pain. 07/27/13   Janne Napoleon, NP  SALINE NASAL SPRAY NA Place into the nose.    [provider]  Simethicone (GAS RELIEF PO) Take by mouth.    [provider]  tamsulosin (FLOMAX) 0.4 MG CAPS capsule TAKE 1 CAPSULE(0.4 MG) BY MOUTH DAILY 07/18/22   Tommie Sams, DO      Allergies    Effexor xr [venlafaxine hcl er], Paxil [paroxetine hcl], and Clindamycin/lincomycin    Review of Systems   Review of Systems  Constitutional:  Negative for fever.  HENT:  Positive for trouble swallowing.   Respiratory:  Negative for shortness of breath.   Cardiovascular:  Negative for chest pain.  All other systems reviewed and are negative.   Physical Exam Updated Vital Signs BP (!) 144/79  Pulse 83   Temp 98 F (36.7 C)   Resp 16   Wt 73.5 kg   SpO2 99%   BMI 23.93 kg/m  Physical Exam Vitals and nursing note reviewed.  Constitutional:      Appearance: He is well-developed. He is not ill-appearing.     Comments: Tolerating secretions  HENT:     Head: Normocephalic and atraumatic.  Eyes:     Pupils: Pupils are equal, round, and reactive to light.  Cardiovascular:     Rate and Rhythm: Normal rate and regular rhythm.  Pulmonary:     Effort: Pulmonary effort is normal. No respiratory distress.  Abdominal:     Palpations: Abdomen is soft.     Tenderness: There is no abdominal tenderness.  Musculoskeletal:     Cervical back: Neck supple.  Lymphadenopathy:     Cervical: No cervical  adenopathy.  Skin:    General: Skin is warm and dry.  Neurological:     Mental Status: He is alert and oriented to person, place, and time.  Psychiatric:        Mood and Affect: Mood normal.     ED Results / Procedures / Treatments   Labs (all labs ordered are listed, but only abnormal results are displayed) Labs Reviewed  CBC WITH DIFFERENTIAL/PLATELET - Abnormal; Notable for the following components:      Result Value   RBC 3.93 (*)    Hemoglobin 12.2 (*)    HCT 35.6 (*)    All other components within normal limits  COMPREHENSIVE METABOLIC PANEL - Abnormal; Notable for the following components:   Potassium 3.3 (*)    Glucose, Bld 125 (*)    Total Protein 8.3 (*)    Total Bilirubin 1.6 (*)    All other components within normal limits    EKG None  Radiology No results found.  Procedures Procedures    Medications Ordered in ED Medications  pantoprazole (PROTONIX) injection 40 mg (has no administration in time range)  ondansetron (ZOFRAN) injection 4 mg (has no administration in time range)  sodium chloride 0.9 % bolus 1,000 mL (1,000 mLs Intravenous New Bag/Given 09/09/22 0359)  glucagon (human recombinant) (GLUCAGEN) injection 1 mg (1 mg Intravenous Given 09/09/22 0359)    ED Course/ Medical Decision Making/ A&P Clinical Course as of 09/09/22 0432  Fri Sep 09, 2022  4132 Spoke to Dr. Marletta Lor, gastroenterology.  Will assess patient for EGD. [CH]    Clinical Course User Index [CH] Jackeline Gutknecht, Mayer Masker, MD                             Medical Decision Making Amount and/or Complexity of Data Reviewed Labs: ordered.  Risk Prescription drug management.   This patient presents to the ED for concern of food impaction/pill esophagitis, this involves an extensive number of treatment options, and is a complaint that carries with it a high risk of complications and morbidity.  I considered the following differential and admission for this acute, potentially life  threatening condition.  The differential diagnosis includes food impaction, pill esophagitis, gastritis  MDM:    This is a 72 year old male who presents with inability to tolerate p.o. after feeling like a pill got stuck yesterday.  He is nontoxic.  He is tolerating his secretions.  However, he is unable to tolerate any liquids or carbonated beverage.  Attempted glucagon and a carbonated beverage without success.  Patient was given a liter of  fluids.  I did obtain some basic lab work given concerns for dehydration and leg spasming.  Mild hypokalemia but otherwise largely reassuring.  Patient was given IV Protonix and Zofran.  Discussed with gastroenterology who will assess for EGD.  (Labs, imaging, consults)  Labs: I Ordered, and personally interpreted labs.  The pertinent results include: CBC, CMP  Imaging Studies ordered: I ordered imaging studies including none I independently visualized and interpreted imaging. I agree with the radiologist interpretation  Additional history obtained from wife at bedside.  External records from outside source obtained and reviewed including prior evaluations  Cardiac Monitoring: The patient was maintained on a cardiac monitor.  If on the cardiac monitor, I personally viewed and interpreted the cardiac monitored which showed an underlying rhythm of: Sinus rhythm  Reevaluation: After the interventions noted above, I reevaluated the patient and found that they have :stayed the same  Social Determinants of Health:  lives independently  Disposition: EGD  Co morbidities that complicate the patient evaluation  Past Medical History:  Diagnosis Date   Arthritis    Bipolar disorder (HCC)    Chronic pain    Depression    GERD (gastroesophageal reflux disease)    Hypertension      Medicines Meds ordered this encounter  Medications   sodium chloride 0.9 % bolus 1,000 mL   glucagon (human recombinant) (GLUCAGEN) injection 1 mg   pantoprazole  (PROTONIX) injection 40 mg   ondansetron (ZOFRAN) injection 4 mg    I have reviewed the patients home medicines and have made adjustments as needed  Problem List / ED Course: Problem List Items Addressed This Visit   None Visit Diagnoses     Food impaction of esophagus, initial encounter    -  Primary                   Final Clinical Impression(s) / ED Diagnoses Final diagnoses:  Food impaction of esophagus, initial encounter    Rx / DC Orders ED Discharge Orders     None         Shon Baton, MD 09/09/22 [phone]

## 2022-09-09 NOTE — Telephone Encounter (Signed)
Hi Mitzie,  Can you please schedule a follow up appointment for this patient in 4 weeks with me or any of the APPs?  Thanks,  Dorion Petillo Castaneda, MD Gastroenterology and Hepatology [facility]  

## 2022-09-09 NOTE — Anesthesia Postprocedure Evaluation (Signed)
Anesthesia Post Note  Patient: Gary Harris  Procedure(s) Performed: ESOPHAGOGASTRODUODENOSCOPY (EGD) WITH PROPOFOL BIOPSY ESOPHAGEAL DILATION  Patient location during evaluation: Phase II Anesthesia Type: General Level of consciousness: awake and alert and oriented Pain management: pain level controlled Vital Signs Assessment: post-procedure vital signs reviewed and stable Respiratory status: spontaneous breathing, nonlabored ventilation and respiratory function stable Cardiovascular status: blood pressure returned to baseline and stable Postop Assessment: no apparent nausea or vomiting Anesthetic complications: no  No notable events documented.   Last Vitals:  Vitals:   09/09/22 1130 09/09/22 1143  BP: (!) 118/58 117/64  Pulse: 68 82  Resp: 11 17  Temp:  36.6 C  SpO2:  100%    Last Pain:  Vitals:   09/09/22 1143  TempSrc:   PainSc: 0-No pain                 Felicidad Sugarman C Keaun Schnabel

## 2022-09-09 NOTE — Transfer of Care (Signed)
Immediate Anesthesia Transfer of Care Note  Patient: Gary Harris  Procedure(s) Performed: ESOPHAGOGASTRODUODENOSCOPY (EGD) WITH PROPOFOL BIOPSY ESOPHAGEAL DILATION  Patient Location: PACU  Anesthesia Type:General  Level of Consciousness: awake, alert , and oriented  Airway & Oxygen Therapy: Patient Spontanous Breathing and Patient connected to nasal cannula oxygen  Post-op Assessment: Report given to RN, Post -op Vital signs reviewed and stable, Patient moving all extremities X 4, and Patient able to stick tongue midline  Post vital signs: Reviewed  Last Vitals:  Vitals Value Taken Time  BP 133/99   Temp 98.6   Pulse 95 09/09/22 1025  Resp 15   SpO2 100 % 09/09/22 1025  Vitals shown include unvalidated device data.  Last Pain:  Vitals:   09/09/22 0949  TempSrc:   PainSc: 0-No pain         Complications: No notable events documented.

## 2022-09-09 NOTE — ED Triage Notes (Signed)
Pt in reporting that he took a Tylenol around noon yesterday, and he felt like it got stuck in his throat. He tried to eat a hotdog later, and has been unable to get his food/water down since. Denies any vomiting or sob

## 2022-09-09 NOTE — Discharge Instructions (Signed)
Eat a soft diet today.  We are waiting for your pathology results.  Take Prilosec (omeprazole) 40 mg by mouth twice a day for two months.  Stop Pepcid. Return to your GI clinic in four weeks.

## 2022-09-09 NOTE — Consult Note (Signed)
Katrinka Blazing, M.D. Gastroenterology & Hepatology [facility] [address] [place], [place] [address] Primary Care Physician: Tommie Sams, DO @PCPADDR @  Referring MD: Ross Marcus, MD  Chief Complaint: Food impaction  History of Present Illness: Gary Harris is a 72 y.o. male with history of bipolar disorder, depression, GERD, hypertension and chronic pain,, who comes to the hospital for evaluation after presenting an episode of food impaction. The patient reports he was took 2 pills yesterday and he felt they got stuck.  After this he reports eating eating hot dog yesterday in the afternoon PM and he felt the food got stuck in the retrosternal area.  Since then, he was not able to swallow and was spitting regularly.  Patient reports that he is now able to drink liquids and he feels that the infection may have resolved.  Drooling: Not anymore Able to swallow food: Has not tried yet but was not able to swallow before Able to drink liquids"yes Previous reflux symptoms: Had heartburn but this has been controlled with omeprazole 20 mg twice daily and famotidine as needed Weight changes: No  Previous episodes: Had a similar episode 4 years ago but resolved on its own.  Has presented some intermittent episodes of dysphagia 3 years but it usually improves on its own without pain that the food gets caught.  Previous EGD: Multiple years ago, no reports available   In the ER, his vital signs were stable. He was protecting his airway. Labs were notable for hypokalemia of 3.3. Marland Kitchen   The patient denies having any nausea, vomiting, fever, chills, hematochezia, melena, hematemesis, abdominal distention, abdominal pain, diarrhea, jaundice, pruritus or weight loss.   Past Medical History: Past Medical History:  Diagnosis Date   Arthritis    Bipolar disorder (HCC)    Chronic pain    Depression    GERD (gastroesophageal reflux disease)     Hypertension     Past Surgical History: Past Surgical History:  Procedure Laterality Date   COLONOSCOPY     KENALOG INJECTION     bilateral hands at [facility]   TONSILLECTOMY      Family History: Family History  Problem Relation Age of Onset   Heart attack Father    Hypertension Mother    Hypertension Sister    Cancer Other        breast    Social History: Social History   Tobacco Use  Smoking Status Never  Smokeless Tobacco Never   Social History   Substance and Sexual Activity  Alcohol Use No   Social History   Substance and Sexual Activity  Drug Use No    Allergies: Allergies  Allergen Reactions   Effexor Xr [Venlafaxine Hcl Er]    Paxil [Paroxetine Hcl]    Clindamycin/Lincomycin Rash    Medications: No current facility-administered medications for this encounter.   Current Outpatient Medications  Medication Sig Dispense Refill   amLODipine (NORVASC) 10 MG tablet TAKE 1 TABLET(10 MG) BY MOUTH DAILY 90 tablet 1   benazepril (LOTENSIN) 20 MG tablet TAKE 1 TABLET(20 MG) BY MOUTH DAILY 90 tablet 1   famotidine (PEPCID) 20 MG tablet TAKE 1 TABLET(20 MG) BY MOUTH TWICE DAILY 180 tablet 1   Fe-Succ Ac-B Cmplx-C-Ca-FA (IROSPAN 24/6) MISC Take 1 tablet by mouth daily.     fexofenadine (ALLEGRA) 60 MG tablet Take by mouth.     FLUoxetine (PROZAC) 20 MG capsule Take 1 capsule by mouth daily. (Patient not taking: Reported  on 07/08/2022)     morphine (MS CONTIN) 60 MG 12 hr tablet Take 60 mg by mouth 2 (two) times daily.     naloxone (NARCAN) nasal spray 4 mg/0.1 mL One spray in either nostril once for known/suspected opioid overdose. May repeat every 2-3 minutes in alternating nostril til EMS arrives     omeprazole (PRILOSEC) 20 MG capsule Take 1 capsule (20 mg total) by mouth 2 (two) times daily. 180 capsule 1   OVER THE COUNTER MEDICATION multivitamins     oxyCODONE-acetaminophen (PERCOCET) 10-325 MG per tablet Take 1 tablet by mouth every 4 (four)  hours as needed for pain. 15 tablet 0   SALINE NASAL SPRAY NA Place into the nose.     Simethicone (GAS RELIEF PO) Take by mouth.     tamsulosin (FLOMAX) 0.4 MG CAPS capsule TAKE 1 CAPSULE(0.4 MG) BY MOUTH DAILY 30 capsule 0    Review of Systems: GENERAL: negative for malaise, night sweats HEENT: No changes in hearing or vision, no nose bleeds or other nasal problems. NECK: Negative for lumps, goiter, pain and significant neck swelling RESPIRATORY: Negative for cough, wheezing CARDIOVASCULAR: Negative for chest pain, leg swelling, palpitations, orthopnea GI: SEE HPI MUSCULOSKELETAL: Negative for joint pain or swelling, back pain, and muscle pain. SKIN: Negative for lesions, rash PSYCH: Negative for sleep disturbance, mood disorder and recent psychosocial stressors. HEMATOLOGY Negative for prolonged bleeding, bruising easily, and swollen nodes. ENDOCRINE: Negative for cold or heat intolerance, polyuria, polydipsia and goiter. NEURO: negative for tremor, gait imbalance, syncope and seizures. The remainder of the review of systems is noncontributory.   Physical Exam: BP 116/77   Pulse 84   Temp 98 F (36.7 C)   Resp 17   Wt 73.5 kg   SpO2 94%   BMI 23.93 kg/m  GENERAL: The patient is AO x3, in no acute distress. HEENT: Head is normocephalic and atraumatic. EOMI are intact. Mouth is well hydrated and without lesions. NECK: Supple. No masses LUNGS: Clear to auscultation. No presence of rhonchi/wheezing/rales. Adequate chest expansion HEART: RRR, normal s1 and s2. ABDOMEN: Soft, nontender, no guarding, no peritoneal signs, and nondistended. BS +. No masses. EXTREMITIES: Without any cyanosis, clubbing, rash, lesions or edema. NEUROLOGIC: AOx3, no focal motor deficit. SKIN: no jaundice, no rashes   Imaging/Labs: as above  I personally reviewed and interpreted the available labs, imaging and endoscopic files.  Impression and Plan: Gary Harris is a 72 y.o. male with  history of bipolar disorder, depression, GERD, hypertension and chronic pain,, who comes to the hospital for evaluation after presenting an episode of food impaction. The patient has not presented any previous red flag signs. DDx includes possible peptic strictures vs EoE . For now, we'll give to keep the patient nothing by mouth until the procedure is performed. He will possibly require general anesthesia due to the risk of aspiration.  - Keep NPO - Emergent EGD - Anesthesia evaluation for general anesthesia - high risk of aspiration  Katrinka Blazing, MD Gastroenterology and Hepatology [facility]    All questions were answered.      Katrinka Blazing, MD Gastroenterology and Hepatology [facility]

## 2022-09-09 NOTE — Op Note (Addendum)
[facility] Patient Name: Gary Harris Procedure Date: 09/09/2022 9:39 AM MRN: [id number] Date of Birth: [birth year] Attending MD: Katrinka Blazing , , [id number] CSN: [id number] Age: 72 Admit Type: Inpatient Procedure:                Upper GI endoscopy Indications:              Foreign body in the esophagus Providers:                Katrinka Blazing, Angelica Ran, Lennice Sites                            Technician, Technician Referring MD:              Medicines:                General Anesthesia Complications:            No immediate complications. Estimated Blood Loss:     Estimated blood loss: none. Procedure:                Pre-Anesthesia Assessment:                           - Prior to the procedure, a History and Physical                            was performed, and patient medications, allergies                            and sensitivities were reviewed. The patient's                            tolerance of previous anesthesia was reviewed.                           - The risks and benefits of the procedure and the                            sedation options and risks were discussed with the                            patient. All questions were answered and informed                            consent was obtained.                           After obtaining informed consent, the endoscope was                            passed under direct vision. Throughout the                            procedure, the patient's blood pressure, pulse, and                            oxygen saturations were monitored continuously. The  [id number]-[id number] ([id number]) scope was introduced through the                            mouth, and advanced to the second part of duodenum.                            The upper GI endoscopy was accomplished without                            difficulty. The patient tolerated the procedure                            well. Scope In: 10:01:41  AM Scope Out: 10:17:18 AM Total Procedure Duration: 0 hours 15 minutes 37 seconds  Findings:      Food was found in the lower third of the esophagus. Removal was       accomplished with a banding device.      One benign-appearing, intrinsic moderate (circumferential scarring or       stenosis; an endoscope may pass) stenosis was found 38 cm from the       incisors. This stenosis measured 1.5 cm (inner diameter) x less than one       cm (in length). The stenosis was traversed. A TTS dilator was passed       through the scope. Dilation with a 15-16.5-18 mm balloon dilator was       performed to 18 mm. The dilation site was examined following endoscope       reinsertion and showed moderate mucosal disruption.      Patchy mildly erythematous mucosa without bleeding was found in the       gastric antrum. Biopsies were taken with a cold forceps for histology.      The examined duodenum was normal. Impression:               - Food in the lower third of the esophagus. Removal                            was successful.                           - Benign-appearing esophageal stenosis. Dilated.                           - Erythematous mucosa in the antrum. Biopsied.                           - Normal examined duodenum. Moderate Sedation:      Per Anesthesia Care Recommendation:           - The patient will be observed post-procedure,                            until all discharge criteria are met.                           - Soft diet today.                           -  Await pathology results.                           - Use Prilosec (omeprazole) 40 mg PO BID for 2                            months. Stop Pepcid.                           - Return to GI clinic in 4 weeks. Procedure Code(s):        --- Professional ---                           [phone], Esophagogastroduodenoscopy, flexible,                            transoral; with removal of foreign body(s)                           43249,  Esophagogastroduodenoscopy, flexible,                            transoral; with transendoscopic balloon dilation of                            esophagus (less than 30 mm diameter)                           43239, 59, Esophagogastroduodenoscopy, flexible,                            transoral; with biopsy, single or multiple Diagnosis Code(s):        --- Professional ---                           X32.440N, Food in esophagus causing other injury,                            initial encounter                           K22.2, Esophageal obstruction                           K31.89, Other diseases of stomach and duodenum                           T18.108A, Unspecified foreign body in esophagus                            causing other injury, initial encounter CPT copyright 2022 American [employer] Association. All rights reserved. The codes documented in this report are preliminary and upon coder review may  be revised to meet current compliance requirements. Katrinka Blazing, MD Katrinka Blazing,  09/09/2022 10:28:10 AM This report has been signed electronically. Number of Addenda: 0

## 2022-09-12 ENCOUNTER — Telehealth (INDEPENDENT_AMBULATORY_CARE_PROVIDER_SITE_OTHER): Payer: Self-pay | Admitting: *Deleted

## 2022-09-12 ENCOUNTER — Telehealth: Payer: Self-pay

## 2022-09-12 LAB — SURGICAL PATHOLOGY

## 2022-09-12 NOTE — Transitions of Care (Post Inpatient/ED Visit) (Signed)
09/12/2022  Name: Gary Harris MRN: [id number] DOB: May 10, 1950  Today's TOC FU Call Status: Today's TOC FU Call Status:: Successful TOC FU Call Competed TOC FU Call Complete Date: 09/12/22  Transition Care Management Follow-up Telephone Call [facility] Type of Discharge: Inpatient Admission Primary Inpatient Discharge Diagnosis:: esophegus injury How have you been since you were released from the hospital?: Better Any questions or concerns?: No  Items Reviewed: Did you receive and understand the discharge instructions provided?: No Medications obtained,verified, and reconciled?: Yes (Medications Reviewed) Any new allergies since your discharge?: No Dietary orders reviewed?: Yes Do you have support at home?: Yes People in Home: spouse  Medications Reviewed Today: Medications Reviewed Today     Reviewed by Karena Addison, LPN (Licensed Practical Nurse) on 09/12/22 at 1147  Med List Status: <None>   Medication Order Taking? Sig Documenting Provider Last Dose Status Informant  amLODipine (NORVASC) 10 MG tablet [id number] No TAKE 1 TABLET(10 MG) BY MOUTH DAILY Tommie Sams, DO 09/07/2022 Active   benazepril (LOTENSIN) 20 MG tablet [id number] No TAKE 1 TABLET(20 MG) BY MOUTH DAILY Tommie Sams, DO 09/07/2022 Active   Fe-Succ Ac-B Cmplx-C-Ca-FA (IROSPAN 24/6) MISC [id number] No Take 1 tablet by mouth daily. [provider] Taking Active   fexofenadine (ALLEGRA) 60 MG tablet [id number] No Take by mouth. [provider] Taking Active   FLUoxetine (PROZAC) 20 MG capsule [id number] No Take 1 capsule by mouth daily.  Patient not taking: Reported on 07/08/2022   [provider] Not Taking Active   morphine (MS CONTIN) 60 MG 12 hr tablet [id number] No Take 60 mg by mouth 2 (two) times daily. [provider] 09/07/2022 Active   naloxone [facility]) nasal spray 4 mg/0.1 mL [id number] No One spray in either nostril once for known/suspected  opioid overdose. May repeat every 2-3 minutes in alternating nostril til EMS arrives [provider] Taking Active   omeprazole (PRILOSEC) 40 MG capsule [id number]  Take 1 capsule (40 mg total) by mouth 2 (two) times daily. Marguerita Merles, Reuel Boom, MD  Active   OVER THE COUNTER MEDICATION [id number]  multivitamins [provider]  Active   oxyCODONE (OXY IR/ROXICODONE) 5 MG immediate release tablet [id number] No Take 10 mg by mouth every 4 (four) hours as needed for severe pain. [provider] 09/09/2022 0615 Active   oxyCODONE-acetaminophen (PERCOCET) 10-325 MG per tablet [id number] No Take 1 tablet by mouth every 4 (four) hours as needed for pain. Kerrie [place], NP Taking Active   SALINE NASAL SPRAY [place] [id number] No Place into the nose. [provider] Taking Active   Simethicone (GAS RELIEF PO) [id number] No Take by mouth. [provider] Taking Active   tamsulosin (FLOMAX) 0.4 MG CAPS capsule [id number]  TAKE 1 CAPSULE(0.4 MG) BY MOUTH DAILY Tommie Sams, DO  Active             Home Care and Equipment/Supplies: Were Home Health Services Ordered?: NA Any new equipment or medical supplies ordered?: NA  Functional Questionnaire: Do you need assistance with bathing/showering or dressing?: No Do you need assistance with meal preparation?: No Do you need assistance with eating?: No Do you have difficulty maintaining continence: No Do you need assistance with getting out of bed/getting out of a chair/moving?: No Do you have difficulty managing or taking your medications?: No  Follow up appointments reviewed: PCP Follow-up appointment confirmed?: NA Specialist Hospital Follow-up appointment confirmed?: No Follow-Up Specialty Provider:: GI Reason Specialist Follow-Up Not  Confirmed: Patient has Specialist Provider Number and will Call for Appointment Do you need transportation to your follow-up appointment?: No Do you understand care options if your  condition(s) worsen?: Yes-patient verbalized understanding    SIGNATURE Karena Addison, LPN [facility] Nurse Health Advisor Direct Dial [phone]

## 2022-09-14 ENCOUNTER — Encounter (HOSPITAL_COMMUNITY): Payer: Self-pay | Admitting: Gastroenterology

## 2022-09-21 NOTE — Telephone Encounter (Signed)
error 

## 2022-10-04 ENCOUNTER — Telehealth: Payer: Self-pay

## 2022-10-04 ENCOUNTER — Other Ambulatory Visit: Payer: Self-pay | Admitting: Family Medicine

## 2022-10-04 MED ORDER — FLUOXETINE HCL 20 MG PO CAPS: 20.0000 mg | ORAL_CAPSULE | Freq: Every day | ORAL | 3 refills | Status: AC

## 2022-10-04 NOTE — Telephone Encounter (Signed)
Prescription Request  10/04/2022  LOV: Visit date not found  What is the name of the medication or equipment?   FLUoxetine (PROZAC) 20 MG capsule    Have you contacted your pharmacy to request a refill? Yes   Which pharmacy would you like this sent to?   [facility] on [address]   Patient notified that their request is being sent to the clinical staff for review and that they should receive a response within 2 business days.   Please advise at Mobile [phone] (mobile)

## 2022-10-05 NOTE — Telephone Encounter (Signed)
 Cook, Jayce G, DO   ? ?Rx sent.   ? ?

## 2022-10-10 ENCOUNTER — Ambulatory Visit: Payer: Medicare Other | Admitting: Gastroenterology

## 2022-10-20 ENCOUNTER — Ambulatory Visit: Payer: Medicare Other | Admitting: Gastroenterology

## 2022-11-15 ENCOUNTER — Ambulatory Visit (INDEPENDENT_AMBULATORY_CARE_PROVIDER_SITE_OTHER): Payer: Medicare Other | Admitting: Gastroenterology

## 2022-11-26 ENCOUNTER — Other Ambulatory Visit: Payer: Self-pay | Admitting: Family Medicine

## 2023-02-02 ENCOUNTER — Ambulatory Visit: Payer: Medicare Other | Admitting: Family Medicine

## 2023-02-10 ENCOUNTER — Other Ambulatory Visit: Payer: Self-pay | Admitting: Family Medicine

## 2023-03-03 ENCOUNTER — Other Ambulatory Visit: Payer: Self-pay | Admitting: Family Medicine

## 2023-03-03 DIAGNOSIS — I10 Essential (primary) hypertension: Secondary | ICD-10-CM

## 2023-04-07 ENCOUNTER — Encounter: Payer: Medicare Other | Admitting: Family Medicine

## 2023-05-02 ENCOUNTER — Encounter: Payer: Medicare Other | Admitting: Family Medicine

## 2023-05-23 ENCOUNTER — Other Ambulatory Visit: Payer: Self-pay | Admitting: Family Medicine

## 2023-07-14 ENCOUNTER — Ambulatory Visit: Payer: Medicare Other

## 2023-07-14 VITALS — Ht 69.0 in | Wt 162.0 lb

## 2023-07-14 DIAGNOSIS — Z0000 Encounter for general adult medical examination without abnormal findings: Secondary | ICD-10-CM

## 2023-07-14 DIAGNOSIS — Z1211 Encounter for screening for malignant neoplasm of colon: Secondary | ICD-10-CM

## 2023-07-14 NOTE — Progress Notes (Signed)
 Subjective:   Gary Harris is a 73 y.o. who presents for a Medicare Wellness preventive visit.  Visit Complete: Virtual I connected with  Alisa App on 07/14/23 by a audio enabled telemedicine application and verified that I am speaking with the correct person using two identifiers.  Patient Location: Home  Provider Location: Home Office  I discussed the limitations of evaluation and management by telemedicine. The patient expressed understanding and agreed to proceed.  Vital Signs: Because this visit was a virtual/telehealth visit, some criteria may be missing or patient reported. Any vitals not documented were not able to be obtained and vitals that have been documented are patient reported.  VideoDeclined- This patient declined Librarian, academic. Therefore the visit was completed with audio only.  Persons Participating in Visit: Patient.  AWV Questionnaire: No: Patient Medicare AWV questionnaire was not completed prior to this visit.  Cardiac Risk Factors include: advanced age (>28men, >36 women);dyslipidemia;hypertension;male gender     Objective:    Today's Vitals   07/14/23 1359  Weight: 162 lb (73.5 kg)  Height: 5\' 9"  (1.753 m)   Body mass index is 23.92 kg/m.     07/14/2023    2:03 PM 09/09/2022    8:29 AM 09/09/2022    3:40 AM 07/08/2022    1:44 PM 03/30/2021   11:03 AM 02/17/2020   10:20 PM  Advanced Directives  Does Patient Have a Medical Advance Directive? No No No Yes Yes Yes  Type of Advance Directive    Living will Healthcare Power of [place];Living will Living will;Healthcare Power of Attorney  Does patient want to make changes to medical advance directive?    No - Patient declined    Copy of Healthcare Power of Attorney in Chart?     No - copy requested   Would patient like information on creating a medical advance directive? Yes (MAU/Ambulatory/Procedural Areas - Information given)  No - Patient declined        Current Medications (verified) Outpatient Encounter Medications as of 07/14/2023  Medication Sig   amLODipine  (NORVASC ) 10 MG tablet TAKE 1 TABLET(10 MG) BY MOUTH DAILY   benazepril  (LOTENSIN ) 20 MG tablet TAKE 1 TABLET(20 MG) BY MOUTH DAILY   Fe-Succ Ac-B Cmplx-C-Ca-FA (IROSPAN 24/6) MISC Take 1 tablet by mouth daily.   fexofenadine (ALLEGRA) 60 MG tablet Take by mouth.   FLUoxetine  (PROZAC ) 20 MG capsule Take 1 capsule (20 mg total) by mouth daily.   morphine  (MS CONTIN ) 60 MG 12 hr tablet Take 60 mg by mouth 2 (two) times daily.   naloxone (NARCAN) nasal spray 4 mg/0.1 mL One spray in either nostril once for known/suspected opioid overdose. May repeat every 2-3 minutes in alternating nostril til EMS arrives   omeprazole  (PRILOSEC) 40 MG capsule Take 1 capsule (40 mg total) by mouth 2 (two) times daily.   OVER THE COUNTER MEDICATION multivitamins   oxyCODONE  (OXY IR/ROXICODONE ) 5 MG immediate release tablet Take 10 mg by mouth every 4 (four) hours as needed for severe pain.   oxyCODONE -acetaminophen  (PERCOCET) 10-325 MG per tablet Take 1 tablet by mouth every 4 (four) hours as needed for pain.   SALINE NASAL SPRAY NA Place into the nose.   Simethicone (GAS RELIEF PO) Take by mouth.   tamsulosin  (FLOMAX ) 0.4 MG CAPS capsule TAKE 1 CAPSULE(0.4 MG) BY MOUTH DAILY   No facility-administered encounter medications on file as of 07/14/2023.    Allergies (verified) Effexor xr [venlafaxine hcl er], Paxil [paroxetine hcl],  and Clindamycin/lincomycin   History: Past Medical History:  Diagnosis Date   Arthritis    Bipolar disorder (HCC)    Chronic pain    Depression    GERD (gastroesophageal reflux disease)    Hypertension    Past Surgical History:  Procedure Laterality Date   BIOPSY  09/09/2022   Procedure: BIOPSY;  Surgeon: Urban Garden, MD;  [facility];  Service: Gastroenterology;;   COLONOSCOPY     ESOPHAGEAL DILATION  09/09/2022   Procedure: ESOPHAGEAL  DILATION;  Surgeon: Urban Garden, MD;  [facility]: [facility];  Service: Gastroenterology;;   ESOPHAGOGASTRODUODENOSCOPY (EGD) WITH PROPOFOL  N/A 09/09/2022   Procedure: ESOPHAGOGASTRODUODENOSCOPY (EGD) WITH PROPOFOL ;  Surgeon: Urban Garden, MD;  [facility]: [facility];  Service: Gastroenterology;  Laterality: N/A;   KENALOG  INJECTION     bilateral hands at [facility]   TONSILLECTOMY     Family History  Problem Relation Age of Onset   Heart attack Father    Hypertension Mother    Hypertension Sister    Cancer Other        breast   Social History   Socioeconomic History   Marital status: Married    Spouse name: Amalia Badder   Number of children: Not on file   Years of education: Not on file   Highest education level: Not on file  Occupational History   Not on file  Tobacco Use   Smoking status: Never   Smokeless tobacco: Never  Substance and Sexual Activity   Alcohol use: No   Drug use: No   Sexual activity: Not on file  Other Topics Concern   Not on file  Social History Narrative   Married x 27 years.   Social Drivers of Corporate investment banker Strain: Low Risk  (07/14/2023)   Overall Financial Resource Strain (CARDIA)    Difficulty of Paying Living Expenses: Not hard at all  Food Insecurity: No Food Insecurity (07/14/2023)   Hunger Vital Sign    Worried About Running Out of Food in the Last Year: Never true    Ran Out of Food in the Last Year: Never true  Transportation Needs: No Transportation Needs (07/14/2023)   PRAPARE - Administrator, Civil Service (Medical): No    Lack of Transportation (Non-Medical): No  Physical Activity: Inactive (07/14/2023)   Exercise Vital Sign    Days of Exercise per Week: 0 days    Minutes of Exercise per Session: 0 min  Stress: No Stress Concern Present (07/14/2023)   [employer] of Occupational Health - Occupational Stress Questionnaire    Feeling of Stress : Not at all  Social  Connections: Moderately Isolated (07/14/2023)   Social Connection and Isolation Panel [NHANES]    Frequency of Communication with Friends and Family: More than three times a week    Frequency of Social Gatherings with Friends and Family: Three times a week    Attends Religious Services: Never    Active Member of Clubs or Organizations: No    Attends Engineer, structural: Never    Marital Status: Married    Tobacco Counseling Counseling given: Not Answered    Clinical Intake:  Pre-visit preparation completed: Yes  Pain : No/denies pain     Diabetes: No  No results found for: "HGBA1C"   How often do you need to have someone help you when you read instructions, pamphlets, or other written materials from your doctor or pharmacy?: 1 - Never  Interpreter Needed?: No  Information entered by :: Seabron Cypress LPN   Activities of Daily Living     07/14/2023    1:59 PM  In your present state of health, do you have any difficulty performing the following activities:  Hearing? 0  Vision? 0  Difficulty concentrating or making decisions? 0  Walking or climbing stairs? 0  Dressing or bathing? 0  Doing errands, shopping? 0  Preparing Food and eating ? N  Using the Toilet? N  In the past six months, have you accidently leaked urine? N  Do you have problems with loss of bowel control? N  Managing your Medications? N  Managing your Finances? N  Housekeeping or managing your Housekeeping? N    Patient Care Team: Cook, Jayce G, DO as PCP - General (Family Medicine) Specialists, [facility] (Orthopedic Surgery) Adelle Agent, MD as Referring Physician (Anesthesiology)  Indicate any recent Medical Services you may have received from other than [facility] providers in the past year (date may be approximate).     Assessment:   This is a routine wellness examination for [place].  Hearing/Vision screen Hearing Screening - Comments:: Denies hearing difficulties    Vision Screening - Comments:: No vision problems; will schedule routine eye exam soon     Goals Addressed   None    Depression Screen     07/14/2023    2:02 PM 08/01/2022    2:34 PM 07/08/2022    1:44 PM 03/30/2021   10:59 AM 03/05/2021   10:39 AM 08/10/2020    2:00 PM 08/09/2017    2:02 PM  PHQ 2/9 Scores  PHQ - 2 Score 0 0 0 1 2 0 1  PHQ- 9 Score  2  2 3  1     Fall Risk     07/14/2023    2:04 PM 08/01/2022    2:34 PM 07/08/2022    1:42 PM 03/30/2021   11:04 AM 08/10/2020    1:59 PM  Fall Risk   Falls in the past year? 0 0 0 0 0  Number falls in past yr: 0 0 0 0 0  Injury with Fall? 0 0 0 0 0  Risk for fall due to : No Fall Risks No Fall Risks No Fall Risks Impaired mobility   Follow up Falls prevention discussed;Education provided;Falls evaluation completed Falls evaluation completed Falls prevention discussed;Education provided;Falls evaluation completed Falls prevention discussed Falls evaluation completed    MEDICARE RISK AT HOME:  Medicare Risk at Home Any stairs in or around the home?: No If so, are there any without handrails?: No Home free of loose throw rugs in walkways, pet beds, electrical cords, etc?: Yes Adequate lighting in your home to reduce risk of falls?: Yes Life alert?: No Use of a cane, walker or w/c?: No Grab bars in the bathroom?: Yes Shower chair or bench in shower?: No Elevated toilet seat or a handicapped toilet?: Yes  TIMED UP AND GO:  Was the test performed?  No  Cognitive Function: 6CIT completed        07/14/2023    2:04 PM 07/08/2022    1:44 PM 03/30/2021   11:06 AM  6CIT Screen  What Year? 0 points 0 points 0 points  What month? 0 points 0 points 0 points  What time? 0 points 0 points 0 points  Count back from 20 0 points 0 points 0 points  Months in reverse 0 points 0 points 0 points  Repeat phrase 0  points 0 points 0 points  Total Score 0 points 0 points 0 points    Immunizations Immunization History  Administered  Date(s) Administered   Fluad Quad(high Dose 65+) 12/17/2018   Influenza, High Dose Seasonal PF 01/23/2018   Influenza,inj,Quad PF,6+ Mos 12/20/2013, 01/12/2017   Influenza-Unspecified 01/19/2012, 12/12/2012, 12/21/2014, 12/31/2015   PNEUMOCOCCAL CONJUGATE-20 08/01/2022   Zoster, Live 04/29/2013    Screening Tests Health Maintenance  Topic Date Due   Hepatitis C Screening  Never done   DTaP/Tdap/Td (1 - Tdap) Never done   Zoster Vaccines- Shingrix (1 of 2) 04/30/1971   Colonoscopy  Never done   COVID-19 Vaccine (1) 07/20/2023 (Originally 04/29/1955)   INFLUENZA VACCINE  10/20/2023   Medicare Annual Wellness (AWV)  07/13/2024   Pneumonia Vaccine 29+ Years old  Completed   HPV VACCINES  Aged Out   Meningococcal B Vaccine  Aged Out    Health Maintenance  Health Maintenance Due  Topic Date Due   Hepatitis C Screening  Never done   DTaP/Tdap/Td (1 - Tdap) Never done   Zoster Vaccines- Shingrix (1 of 2) 04/30/1971   Colonoscopy  Never done   Health Maintenance Items Addressed: Cologuard Ordered; patient wants to call back to schedule yearly visit with pcp   Additional Screening:  Vision Screening: Recommended annual ophthalmology exams for early detection of glaucoma and other disorders of the eye.  Dental Screening: Recommended annual dental exams for proper oral hygiene  Community Resource Referral / Chronic Care Management: CRR required this visit?  No   CCM required this visit?  No     Plan:     I have personally reviewed and noted the following in the patient's chart:   Medical and social history Use of alcohol, tobacco or illicit drugs  Current medications and supplements including opioid prescriptions. Patient is currently taking opioid prescriptions. Information provided to patient regarding non-opioid alternatives. Patient advised to discuss non-opioid treatment plan with their provider. Functional ability and status Nutritional status Physical  activity Advanced directives List of other physicians Hospitalizations, surgeries, and ER visits in previous 12 months Vitals Screenings to include cognitive, depression, and falls Referrals and appointments  In addition, I have reviewed and discussed with patient certain preventive protocols, quality metrics, and best practice recommendations. A written personalized care plan for preventive services as well as general preventive health recommendations were provided to patient.     Seabron Cypress [place], [place]   [id number]/4696   After Visit Summary: (MyChart) Due to this being a telephonic visit, the after visit summary with patients personalized plan was offered to patient via MyChart   Notes: Nothing significant to report at this time.

## 2023-07-14 NOTE — Patient Instructions (Signed)
 Gary Harris , Thank you for taking time to come for your Medicare Wellness Visit. I appreciate your ongoing commitment to your health goals. Please review the following plan we discussed and let me know if I can assist you in the future.   Referrals/Orders/Follow-Ups/Clinician Recommendations: Aim for 30 minutes of exercise or brisk walking, 6-8 glasses of water, and 5 servings of fruits and vegetables each day.  This is a list of the screening recommended for you and due dates:  Health Maintenance  Topic Date Due   Hepatitis C Screening  Never done   DTaP/Tdap/Td vaccine (1 - Tdap) Never done   Zoster (Shingles) Vaccine (1 of 2) 04/30/1971   Colon Cancer Screening  Never done   COVID-19 Vaccine (1) 07/20/2023*   Flu Shot  10/20/2023   Medicare Annual Wellness Visit  07/13/2024   Pneumonia Vaccine  Completed   HPV Vaccine  Aged Out   Meningitis B Vaccine  Aged Out  *Topic was postponed. The date shown is not the original due date.    Advanced directives: (ACP Link)Information on Advanced Care Planning can be found at [facility]  Secretary of [facility] Advance Health Care Directives Advance Health Care Directives. [email or web address]   Next Medicare Annual Wellness Visit scheduled for next year: Yes

## 2023-08-21 ENCOUNTER — Other Ambulatory Visit: Payer: Self-pay | Admitting: Family Medicine

## 2023-08-22 ENCOUNTER — Other Ambulatory Visit: Payer: Self-pay

## 2023-08-22 MED ORDER — TAMSULOSIN HCL 0.4 MG PO CAPS: ORAL_CAPSULE | ORAL | 3 refills | Status: AC

## 2023-09-03 ENCOUNTER — Other Ambulatory Visit: Payer: Self-pay | Admitting: Family Medicine

## 2023-09-03 DIAGNOSIS — I10 Essential (primary) hypertension: Secondary | ICD-10-CM

## 2023-09-04 ENCOUNTER — Other Ambulatory Visit: Payer: Self-pay

## 2023-09-04 DIAGNOSIS — I10 Essential (primary) hypertension: Secondary | ICD-10-CM

## 2023-09-04 MED ORDER — AMLODIPINE BESYLATE 10 MG PO TABS
ORAL_TABLET | ORAL | 3 refills | Status: DC
Start: 2023-09-04 — End: 2023-09-04

## 2023-09-04 MED ORDER — AMLODIPINE BESYLATE 10 MG PO TABS
ORAL_TABLET | ORAL | 3 refills | Status: AC
Start: 2023-09-04 — End: ?

## 2023-09-07 ENCOUNTER — Other Ambulatory Visit: Payer: Self-pay | Admitting: Family Medicine

## 2023-09-07 DIAGNOSIS — I10 Essential (primary) hypertension: Secondary | ICD-10-CM

## 2024-01-24 ENCOUNTER — Encounter (INDEPENDENT_AMBULATORY_CARE_PROVIDER_SITE_OTHER): Payer: Self-pay | Admitting: Gastroenterology
